# Patient Record
Sex: Male | Born: 1965 | Race: White | Hispanic: No | Smoking: Former smoker
Health system: Southern US, Community
[De-identification: ages and names within clinical notes are randomized; demographics above are authoritative.]

## PROBLEM LIST (undated history)

## (undated) DIAGNOSIS — J392 Other diseases of pharynx: Secondary | ICD-10-CM

## (undated) DIAGNOSIS — Z8489 Family history of other specified conditions: Secondary | ICD-10-CM

## (undated) DIAGNOSIS — I10 Essential (primary) hypertension: Secondary | ICD-10-CM

## (undated) DIAGNOSIS — M1990 Unspecified osteoarthritis, unspecified site: Secondary | ICD-10-CM

## (undated) DIAGNOSIS — E119 Type 2 diabetes mellitus without complications: Secondary | ICD-10-CM

## (undated) DIAGNOSIS — J449 Chronic obstructive pulmonary disease, unspecified: Secondary | ICD-10-CM

## (undated) HISTORY — PX: BACK SURGERY: SHX140

## (undated) HISTORY — PX: MR LOWER LEG LEFT (ARMC HX): HXRAD1784

## (undated) HISTORY — PX: NECK SURGERY: SHX720

## (undated) HISTORY — PX: WRIST SURGERY: SHX841

## (undated) HISTORY — PX: FRACTURE SURGERY: SHX138

---

## 1987-10-19 DIAGNOSIS — S069XAA Unspecified intracranial injury with loss of consciousness status unknown, initial encounter: Secondary | ICD-10-CM

## 1987-10-19 HISTORY — DX: Unspecified intracranial injury with loss of consciousness status unknown, initial encounter: S06.9XAA

## 1998-03-29 ENCOUNTER — Emergency Department (HOSPITAL_COMMUNITY): Admission: EM | Admit: 1998-03-29 | Discharge: 1998-03-29 | Payer: Self-pay | Admitting: Emergency Medicine

## 2001-04-04 ENCOUNTER — Encounter: Payer: Self-pay | Admitting: Emergency Medicine

## 2001-04-04 ENCOUNTER — Emergency Department (HOSPITAL_COMMUNITY): Admission: EM | Admit: 2001-04-04 | Discharge: 2001-04-04 | Payer: Self-pay | Admitting: Emergency Medicine

## 2001-05-20 ENCOUNTER — Inpatient Hospital Stay (HOSPITAL_COMMUNITY): Admission: AC | Admit: 2001-05-20 | Discharge: 2001-06-02 | Payer: Self-pay

## 2001-05-20 ENCOUNTER — Encounter: Payer: Self-pay | Admitting: Emergency Medicine

## 2001-05-22 ENCOUNTER — Encounter: Payer: Self-pay | Admitting: Neurosurgery

## 2001-07-20 ENCOUNTER — Encounter: Payer: Self-pay | Admitting: Neurosurgery

## 2001-07-20 ENCOUNTER — Ambulatory Visit (HOSPITAL_COMMUNITY): Admission: RE | Admit: 2001-07-20 | Discharge: 2001-07-20 | Payer: Self-pay | Admitting: Neurosurgery

## 2001-09-22 ENCOUNTER — Encounter: Payer: Self-pay | Admitting: Specialist

## 2001-09-22 ENCOUNTER — Observation Stay (HOSPITAL_COMMUNITY): Admission: RE | Admit: 2001-09-22 | Discharge: 2001-09-23 | Payer: Self-pay | Admitting: Specialist

## 2002-08-03 ENCOUNTER — Emergency Department (HOSPITAL_COMMUNITY): Admission: AC | Admit: 2002-08-03 | Discharge: 2002-08-03 | Payer: Self-pay

## 2002-08-03 ENCOUNTER — Encounter: Payer: Self-pay | Admitting: Emergency Medicine

## 2007-04-12 ENCOUNTER — Emergency Department: Payer: Self-pay | Admitting: Emergency Medicine

## 2007-05-27 ENCOUNTER — Emergency Department: Payer: Self-pay | Admitting: Emergency Medicine

## 2008-06-07 ENCOUNTER — Emergency Department: Payer: Self-pay | Admitting: Emergency Medicine

## 2008-06-23 ENCOUNTER — Emergency Department (HOSPITAL_COMMUNITY): Admission: EM | Admit: 2008-06-23 | Discharge: 2008-06-23 | Payer: Self-pay | Admitting: Emergency Medicine

## 2008-10-29 ENCOUNTER — Emergency Department: Payer: Self-pay | Admitting: Emergency Medicine

## 2009-06-20 ENCOUNTER — Ambulatory Visit: Payer: Self-pay | Admitting: Psychiatry

## 2009-06-20 ENCOUNTER — Emergency Department (HOSPITAL_COMMUNITY): Admission: EM | Admit: 2009-06-20 | Discharge: 2009-06-20 | Payer: Self-pay | Admitting: Emergency Medicine

## 2009-06-20 ENCOUNTER — Inpatient Hospital Stay (HOSPITAL_COMMUNITY): Admission: RE | Admit: 2009-06-20 | Discharge: 2009-06-26 | Payer: Self-pay | Admitting: Psychiatry

## 2011-01-22 LAB — CBC
HCT: 43.1 % (ref 39.0–52.0)
Hemoglobin: 15 g/dL (ref 13.0–17.0)
MCHC: 34.7 g/dL (ref 30.0–36.0)
MCV: 92.7 fL (ref 78.0–100.0)
Platelets: 138 K/uL — ABNORMAL LOW (ref 150–400)
RBC: 4.65 MIL/uL (ref 4.22–5.81)
RDW: 13.1 % (ref 11.5–15.5)
WBC: 7.5 K/uL (ref 4.0–10.5)

## 2011-01-22 LAB — COMPREHENSIVE METABOLIC PANEL WITH GFR
ALT: 44 U/L (ref 0–53)
AST: 51 U/L — ABNORMAL HIGH (ref 0–37)
Albumin: 3.6 g/dL (ref 3.5–5.2)
Alkaline Phosphatase: 86 U/L (ref 39–117)
BUN: 7 mg/dL (ref 6–23)
CO2: 27 meq/L (ref 19–32)
Calcium: 8.5 mg/dL (ref 8.4–10.5)
Chloride: 104 meq/L (ref 96–112)
Creatinine, Ser: 0.78 mg/dL (ref 0.4–1.5)
GFR calc non Af Amer: >60 mL/min
Glucose, Bld: 94 mg/dL (ref 70–99)
Potassium: 3.8 meq/L (ref 3.5–5.1)
Sodium: 139 meq/L (ref 135–145)
Total Bilirubin: 0.6 mg/dL (ref 0.3–1.2)
Total Protein: 6.3 g/dL (ref 6.0–8.3)

## 2011-01-22 LAB — DIFFERENTIAL
Lymphocytes Relative: 21 % (ref 12–46)
Lymphs Abs: 1.6 10*3/uL (ref 0.7–4.0)
Neutrophils Relative %: 70 % (ref 43–77)

## 2011-01-22 LAB — RAPID URINE DRUG SCREEN, HOSP PERFORMED
Amphetamines: NOT DETECTED
Barbiturates: NOT DETECTED
Cocaine: NOT DETECTED
Opiates: POSITIVE — AB
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
## 2011-03-05 NOTE — Op Note (Signed)
[facility]  Patient:    Clarence Cook, Clarence Cook                      MRN: [id number] Proc. Date: 05/22/01 Adm. Date:  [id number] Disc. Date: [id number] Attending:  Doug Sou                           Operative Report  PREOPERATIVE DIAGNOSIS:  Status post intramedullary rodding, left grade 2 tibia-fibula fracture.  POSTOPERATIVE DIAGNOSIS:  Status post intramedullary rodding, left grade 2 tibia-fibula fracture.  PROCEDURE PERFORMED:  I&D and rotational ______ of the anterior tibialis, application of V.A.C..  ANESTHESIA:  General.  BRIEF HISTORY AND INDICATIONS:  This gentleman is status post IM rodding, I&D on Saturday morning, underwent anterior cervical diskectomy and fusion by Dr. Jordan Likes.  Operative intervention was indicated for relook at the tibial wound for for coverage of the open tibia.  The risks and benefits discussed including bleeding, infection, damage to vascular structures, no changes in symptoms, need for formal coverage.  DESCRIPTION OF PROCEDURE:  With the patient in the supine position after induction of adequate general anesthesia, surgery performed by Dr. Jordan Likes, the left lower extremity dressing was removed, prepped and draped.  The previous retention sutures were removed.  The wound looked viable.  No evidence of necrotic tissue, no purulent drainage.  The wound was copiously irrigated. Antibiotic impregnated irrigation delivered by pulsatile lavage.  Following this, soft tissue coverage was utilized, utilizing the anterior tibial tendon, anterior tibial musculature over the anterior aspect of the tibia and subcutaneous tissue and completely covering the tibia.  Dr. Delia Chimes was consulted for plastic surgery, and he suggested a V.A.C. coverage of the remaining wound, as the skin edges were unable to be approximated.  The V.A.C. dressing was applied.  The sterile end connected to a V.A.C. suction.  After a sterile dressing, the  leg was placed in a stirrup-type splint and secured with an ACE bandage.  The patient was awakened without difficulty and transported to recovery in satisfactory condition.  The patient tolerated the procedure well, and there were no complications. DD:  05/22/01 TD:  05/23/01 Job: [id number]
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
## 2011-03-05 NOTE — Op Note (Signed)
[facility]  Patient:    Clarence Cook, Clarence Cook Visit Number: [id number] MRN: [id number]          Service Type: SUR Location: 4W [id number] [id number] Attending Physician:  Pierce Crane Dictated by:   Javier Docker, M.D. Proc. Date: 09/22/01 Admit Date:  09/22/2001 Discharge Date: 09/23/2001                             Operative Report  PREOPERATIVE DIAGNOSIS:  Retained hardware of the left tibia, delayed union.  POSTOPERATIVE DIAGNOSIS:  Retained hardware of the left tibia, delayed union.  PROCEDURE:  Removal of IM tibial rod, left knee.  ANESTHESIA:  General.  TOURNIQUET TIME:  30 minutes.  BRIEF HISTORY:  This 45 year old is status post intermedullary rodding for a grade 3 significant tibia injury. He has gone on to heal his soft tissues, there is a persistent gap on the needle side of the tibia. It was first thought that otomizing this would be appropriate by removing the cross linking screws but it was felt that due to the fact that the rod was near the ankle joint and only 1 cm or 2 cm below the fracture site that otomizing this would risk projecting the rod through the ankle joint and it was therefore best safe to remove the rod and the screws so he could weightbear on this and hopefully compress the medial aspect. The risks and benefits were discussed including bleeding, infection, injury to neurovascular structures, infection, no change in symptoms, need for osteotomy in the future, etc.  TECHNIQUE:  The patient in supine position, after an adequate level of general anesthesia and 1 gm of Kefzol, the patients left lower extremity was prepped and draped and exsanguinated in the usual sterile fashion. The thigh tourniquet was inflated to 350 mmHg. C-arm augmentation incisions were made over the previous incisions ______ the cross linking screw through the skin only with blunt dissection down to the screw heads without difficulty. Each of the  three were removed. The hardware was in its entirety. The incisions were irrigated and stapled. An incision was made over the knee. A parapatellar arthrotomy was performed. The rod head was identified without difficulty, soft tissue removed, engaged with the rod remover and with the knee in flexion without difficulty the rod was removed. The intermedullary canal was irrigated and curetted. There was no evidence of infection. There was no active bleeding. The arthrotomy was repaired with #1 Vicryl interrupted figure-of-eight sutures, subcutaneous tissue reapproximated with 2-0 Vicryl simple sutures, the skin was reapproximated with staples. The wound was dressed sterilely, secured with an Ace bandage, tourniquet was deflated and there was adequate vascularization in the lower extremity.  The patient tolerated the procedure well with no complications. Blood loss was about 30 cc. Under C-arm augmentation, I was not able to manipulate to determine any significant motion at the fracture site. Dictated by:   Javier Docker, M.D. Attending Physician:  Pierce Crane DD:  09/22/01 TD:  09/23/01 Job: [id number]
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
## 2012-12-25 ENCOUNTER — Emergency Department (HOSPITAL_COMMUNITY)
Admission: EM | Admit: 2012-12-25 | Discharge: 2012-12-25 | Disposition: A | Discharge status: Home / Self Care | Payer: No Typology Code available for payment source | Attending: Emergency Medicine | Admitting: Emergency Medicine

## 2012-12-25 ENCOUNTER — Emergency Department (HOSPITAL_COMMUNITY): Payer: No Typology Code available for payment source

## 2012-12-25 ENCOUNTER — Encounter (HOSPITAL_COMMUNITY): Payer: Self-pay | Admitting: *Deleted

## 2012-12-25 DIAGNOSIS — S298XXA Other specified injuries of thorax, initial encounter: Secondary | ICD-10-CM | POA: Insufficient documentation

## 2012-12-25 DIAGNOSIS — F17200 Nicotine dependence, unspecified, uncomplicated: Secondary | ICD-10-CM | POA: Insufficient documentation

## 2012-12-25 DIAGNOSIS — Y93I9 Activity, other involving external motion: Secondary | ICD-10-CM | POA: Insufficient documentation

## 2012-12-25 DIAGNOSIS — S42002A Fracture of unspecified part of left clavicle, initial encounter for closed fracture: Secondary | ICD-10-CM

## 2012-12-25 DIAGNOSIS — S139XXA Sprain of joints and ligaments of unspecified parts of neck, initial encounter: Secondary | ICD-10-CM | POA: Insufficient documentation

## 2012-12-25 DIAGNOSIS — Z9889 Other specified postprocedural states: Secondary | ICD-10-CM | POA: Insufficient documentation

## 2012-12-25 DIAGNOSIS — R0789 Other chest pain: Secondary | ICD-10-CM

## 2012-12-25 DIAGNOSIS — Y92410 Unspecified street and highway as the place of occurrence of the external cause: Secondary | ICD-10-CM | POA: Insufficient documentation

## 2012-12-25 DIAGNOSIS — S42009A Fracture of unspecified part of unspecified clavicle, initial encounter for closed fracture: Secondary | ICD-10-CM | POA: Insufficient documentation

## 2012-12-25 LAB — POCT I-STAT, CHEM 8
BUN: 13 mg/dL (ref 6–23)
Chloride: 109 mEq/L (ref 96–112)
Creatinine, Ser: 1.1 mg/dL (ref 0.50–1.35)
Potassium: 3.9 mEq/L (ref 3.5–5.1)
Sodium: 142 mEq/L (ref 135–145)

## 2012-12-25 LAB — POCT I-STAT TROPONIN I

## 2012-12-25 MED ORDER — OXYCODONE-ACETAMINOPHEN 5-325 MG PO TABS
2.0000 | ORAL_TABLET | Freq: Once | ORAL | Status: AC
  Administered 2012-12-25: 2 via ORAL
  Filled 2012-12-25: qty 2

## 2012-12-25 MED ORDER — OXYCODONE-ACETAMINOPHEN 5-325 MG PO TABS: 1.0000 | ORAL_TABLET | ORAL | Status: DC | PRN

## 2012-12-25 MED ORDER — IBUPROFEN 800 MG PO TABS: 800.0000 mg | ORAL_TABLET | Freq: Three times a day (TID) | ORAL | Status: DC

## 2012-12-25 NOTE — ED Notes (Signed)
Incentive spirometer given to, instructions given.  Patient able to get to 2500 immediately.

## 2012-12-25 NOTE — ED Notes (Signed)
Advised of the wait time 
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
## 2012-12-25 NOTE — Progress Notes (Signed)
Orthopedic Tech Progress Note Patient Details:  Clarence Cook 12/18/1965 [id number]  Ortho Devices Type of Ortho Device: Sling immobilizer Ortho Device/Splint Location: left arm Ortho Device/Splint Interventions: Application   Crawford, Rembert 12/25/2012, 9:20 PM
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
## 2012-12-25 NOTE — ED Notes (Signed)
Discharge inst given to patient.  Voiced understanding.  Incentive spirometer with patient

## 2012-12-27 NOTE — ED Provider Notes (Signed)
Medical screening examination/treatment/procedure(s) were performed by non-physician practitioner and as supervising physician I was immediately available for consultation/collaboration.  Raeford Razor, MD 12/27/12 2142

## 2013-03-28 ENCOUNTER — Inpatient Hospital Stay (HOSPITAL_COMMUNITY)
Admission: EM | Admit: 2013-03-28 | Discharge: 2013-04-02 | DRG: 552 | Disposition: A | Discharge status: Home / Self Care | Payer: No Typology Code available for payment source | Attending: General Surgery | Admitting: General Surgery

## 2013-03-28 ENCOUNTER — Emergency Department (HOSPITAL_COMMUNITY): Payer: No Typology Code available for payment source

## 2013-03-28 ENCOUNTER — Encounter (HOSPITAL_COMMUNITY): Payer: Self-pay | Admitting: Radiology

## 2013-03-28 DIAGNOSIS — IMO0002 Reserved for concepts with insufficient information to code with codable children: Secondary | ICD-10-CM

## 2013-03-28 DIAGNOSIS — D72829 Elevated white blood cell count, unspecified: Secondary | ICD-10-CM | POA: Diagnosis present

## 2013-03-28 DIAGNOSIS — S12100A Unspecified displaced fracture of second cervical vertebra, initial encounter for closed fracture: Principal | ICD-10-CM | POA: Diagnosis present

## 2013-03-28 DIAGNOSIS — F17200 Nicotine dependence, unspecified, uncomplicated: Secondary | ICD-10-CM | POA: Diagnosis present

## 2013-03-28 DIAGNOSIS — R339 Retention of urine, unspecified: Secondary | ICD-10-CM | POA: Diagnosis present

## 2013-03-28 DIAGNOSIS — S12290A Other displaced fracture of third cervical vertebra, initial encounter for closed fracture: Secondary | ICD-10-CM

## 2013-03-28 DIAGNOSIS — F1210 Cannabis abuse, uncomplicated: Secondary | ICD-10-CM | POA: Diagnosis present

## 2013-03-28 DIAGNOSIS — J4489 Other specified chronic obstructive pulmonary disease: Secondary | ICD-10-CM | POA: Diagnosis present

## 2013-03-28 DIAGNOSIS — J449 Chronic obstructive pulmonary disease, unspecified: Secondary | ICD-10-CM | POA: Diagnosis present

## 2013-03-28 DIAGNOSIS — S065X9A Traumatic subdural hemorrhage with loss of consciousness of unspecified duration, initial encounter: Secondary | ICD-10-CM

## 2013-03-28 DIAGNOSIS — F1010 Alcohol abuse, uncomplicated: Secondary | ICD-10-CM | POA: Diagnosis present

## 2013-03-28 DIAGNOSIS — Y929 Unspecified place or not applicable: Secondary | ICD-10-CM

## 2013-03-28 DIAGNOSIS — S12200A Unspecified displaced fracture of third cervical vertebra, initial encounter for closed fracture: Secondary | ICD-10-CM | POA: Diagnosis present

## 2013-03-28 DIAGNOSIS — F319 Bipolar disorder, unspecified: Secondary | ICD-10-CM | POA: Diagnosis present

## 2013-03-28 MED ORDER — MORPHINE SULFATE 4 MG/ML IJ SOLN
4.0000 mg | Freq: Once | INTRAMUSCULAR | Status: AC
  Administered 2013-03-28: 4 mg via INTRAVENOUS
  Filled 2013-03-28: qty 1
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
## 2013-03-29 ENCOUNTER — Emergency Department (HOSPITAL_COMMUNITY): Payer: No Typology Code available for payment source

## 2013-03-29 ENCOUNTER — Encounter (HOSPITAL_COMMUNITY): Payer: Self-pay | Admitting: Neurological Surgery

## 2013-03-29 DIAGNOSIS — S065X9A Traumatic subdural hemorrhage with loss of consciousness of unspecified duration, initial encounter: Secondary | ICD-10-CM

## 2013-03-29 DIAGNOSIS — S129XXA Fracture of neck, unspecified, initial encounter: Secondary | ICD-10-CM

## 2013-03-29 LAB — POCT I-STAT, CHEM 8
BUN: 5 mg/dL — ABNORMAL LOW (ref 6–23)
Creatinine, Ser: 1.2 mg/dL (ref 0.50–1.35)
Glucose, Bld: 147 mg/dL — ABNORMAL HIGH (ref 70–99)
Hemoglobin: 15.6 g/dL (ref 13.0–17.0)
Potassium: 3.9 mEq/L (ref 3.5–5.1)

## 2013-03-29 LAB — CBC
HCT: 42.1 % (ref 39.0–52.0)
Hemoglobin: 14.4 g/dL (ref 13.0–17.0)
MCH: 31.6 pg (ref 26.0–34.0)
MCHC: 35.4 g/dL (ref 30.0–36.0)
MCV: 89.1 fL (ref 78.0–100.0)
MCV: 89.8 fL (ref 78.0–100.0)
Platelets: 132 10*3/uL — ABNORMAL LOW (ref 150–400)
RBC: 4.97 MIL/uL (ref 4.22–5.81)
RBC: 4.69 MIL/uL (ref 4.22–5.81)
RDW: 13.7 % (ref 11.5–15.5)
WBC: 18.2 10*3/uL — ABNORMAL HIGH (ref 4.0–10.5)

## 2013-03-29 LAB — COMPREHENSIVE METABOLIC PANEL
ALT: 38 U/L (ref 0–53)
AST: 49 U/L — ABNORMAL HIGH (ref 0–37)
Albumin: 4 g/dL (ref 3.5–5.2)
Alkaline Phosphatase: 74 U/L (ref 39–117)
Chloride: 103 mEq/L (ref 96–112)
Potassium: 4.5 mEq/L (ref 3.5–5.1)
Sodium: 139 mEq/L (ref 135–145)
Total Bilirubin: 0.3 mg/dL (ref 0.3–1.2)

## 2013-03-29 LAB — BASIC METABOLIC PANEL
BUN: 6 mg/dL (ref 6–23)
CO2: 25 mEq/L (ref 19–32)
Chloride: 105 mEq/L (ref 96–112)
Creatinine, Ser: 0.8 mg/dL (ref 0.50–1.35)
Glucose, Bld: 132 mg/dL — ABNORMAL HIGH (ref 70–99)

## 2013-03-29 LAB — CDS SEROLOGY

## 2013-03-29 LAB — SAMPLE TO BLOOD BANK

## 2013-03-29 MED ORDER — TRAMADOL HCL 50 MG PO TABS
50.0000 mg | ORAL_TABLET | Freq: Four times a day (QID) | ORAL | Status: DC | PRN
  Administered 2013-03-29: 50 mg via ORAL
  Filled 2013-03-29: qty 1

## 2013-03-29 MED ORDER — BETHANECHOL CHLORIDE 25 MG PO TABS
25.0000 mg | ORAL_TABLET | Freq: Four times a day (QID) | ORAL | Status: DC
  Administered 2013-03-29 – 2013-04-02 (×15): 25 mg via ORAL
  Filled 2013-03-29 (×21): qty 1

## 2013-03-29 MED ORDER — TRAZODONE HCL 100 MG PO TABS
100.0000 mg | ORAL_TABLET | Freq: Every day | ORAL | Status: DC
  Administered 2013-03-29 – 2013-04-01 (×5): 100 mg via ORAL
  Filled 2013-03-29: qty 1
  Filled 2013-03-29: qty 2
  Filled 2013-03-29: qty 1
  Filled 2013-03-29: qty 2
  Filled 2013-03-29 (×2): qty 1
  Filled 2013-03-29: qty 2

## 2013-03-29 MED ORDER — BISACODYL 10 MG RE SUPP: 10.0000 mg | Freq: Every day | RECTAL | Status: DC | PRN

## 2013-03-29 MED ORDER — OLANZAPINE 10 MG PO TABS
20.0000 mg | ORAL_TABLET | Freq: Every day | ORAL | Status: DC
  Administered 2013-03-29 – 2013-04-01 (×5): 20 mg via ORAL
  Filled 2013-03-29: qty 2
  Filled 2013-03-29 (×3): qty 4
  Filled 2013-03-29 (×3): qty 2

## 2013-03-29 MED ORDER — OXYCODONE HCL 5 MG PO TABS
10.0000 mg | ORAL_TABLET | ORAL | Status: DC | PRN
  Administered 2013-03-29 – 2013-03-30 (×4): 10 mg via ORAL
  Filled 2013-03-29 (×4): qty 2

## 2013-03-29 MED ORDER — DOCUSATE SODIUM 100 MG PO CAPS
100.0000 mg | ORAL_CAPSULE | Freq: Two times a day (BID) | ORAL | Status: DC
  Administered 2013-03-29 – 2013-04-02 (×9): 100 mg via ORAL
  Filled 2013-03-29 (×12): qty 1

## 2013-03-29 MED ORDER — LORAZEPAM 2 MG/ML IJ SOLN
1.0000 mg | Freq: Once | INTRAMUSCULAR | Status: AC
  Administered 2013-03-29: 1 mg via INTRAVENOUS
  Filled 2013-03-29: qty 1

## 2013-03-29 MED ORDER — POTASSIUM CHLORIDE IN NACL 20-0.9 MEQ/L-% IV SOLN
INTRAVENOUS | Status: DC
  Administered 2013-03-29: 02:00:00 via INTRAVENOUS
  Filled 2013-03-29 (×3): qty 1000

## 2013-03-29 MED ORDER — PANTOPRAZOLE SODIUM 40 MG PO TBEC
40.0000 mg | DELAYED_RELEASE_TABLET | Freq: Every day | ORAL | Status: DC
  Administered 2013-03-29 – 2013-04-02 (×5): 40 mg via ORAL
  Filled 2013-03-29 (×5): qty 1

## 2013-03-29 MED ORDER — PANTOPRAZOLE SODIUM 40 MG IV SOLR
40.0000 mg | Freq: Every day | INTRAVENOUS | Status: DC
  Filled 2013-03-29 (×3): qty 40

## 2013-03-29 MED ORDER — CITALOPRAM HYDROBROMIDE 20 MG PO TABS
20.0000 mg | ORAL_TABLET | Freq: Every day | ORAL | Status: DC
  Administered 2013-03-29 – 2013-04-02 (×5): 20 mg via ORAL
  Filled 2013-03-29 (×2): qty 2
  Filled 2013-03-29 (×3): qty 1

## 2013-03-29 MED ORDER — HYDROMORPHONE HCL PF 2 MG/ML IJ SOLN
INTRAMUSCULAR | Status: AC
  Administered 2013-03-29: 2 mg
  Filled 2013-03-29: qty 1

## 2013-03-29 MED ORDER — PNEUMOCOCCAL VAC POLYVALENT 25 MCG/0.5ML IJ INJ
0.5000 mL | INJECTION | INTRAMUSCULAR | Status: AC
  Administered 2013-03-30: 0.5 mL via INTRAMUSCULAR
  Filled 2013-03-29 (×2): qty 0.5

## 2013-03-29 MED ORDER — FENOFIBRATE 54 MG PO TABS
54.0000 mg | ORAL_TABLET | Freq: Every day | ORAL | Status: DC
  Administered 2013-03-29 – 2013-04-02 (×5): 54 mg via ORAL
  Filled 2013-03-29 (×5): qty 1

## 2013-03-29 MED ORDER — ONDANSETRON HCL 4 MG PO TABS: 4.0000 mg | ORAL_TABLET | Freq: Four times a day (QID) | ORAL | Status: DC | PRN

## 2013-03-29 MED ORDER — ONDANSETRON HCL 4 MG/2ML IJ SOLN
4.0000 mg | Freq: Four times a day (QID) | INTRAMUSCULAR | Status: DC | PRN
  Administered 2013-03-29: 4 mg via INTRAVENOUS
  Filled 2013-03-29: qty 2

## 2013-03-29 MED ORDER — HYDROMORPHONE HCL PF 1 MG/ML IJ SOLN
1.0000 mg | INTRAMUSCULAR | Status: DC | PRN
  Administered 2013-03-29 (×2): 1 mg via INTRAVENOUS
  Administered 2013-03-29 (×4): 2 mg via INTRAVENOUS
  Administered 2013-03-30: 1 mg via INTRAVENOUS
  Administered 2013-03-30 (×2): 2 mg via INTRAVENOUS
  Administered 2013-03-30: 1 mg via INTRAVENOUS
  Administered 2013-03-31 – 2013-04-01 (×4): 2 mg via INTRAVENOUS
  Administered 2013-04-01 (×4): 1 mg via INTRAVENOUS
  Filled 2013-03-29: qty 1
  Filled 2013-03-29 (×2): qty 2
  Filled 2013-03-29: qty 1
  Filled 2013-03-29: qty 2
  Filled 2013-03-29 (×10): qty 1
  Filled 2013-03-29 (×3): qty 2
  Filled 2013-03-29 (×2): qty 1
  Filled 2013-03-29: qty 2
  Filled 2013-03-29: qty 1
  Filled 2013-03-29 (×4): qty 2
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
## 2013-03-29 NOTE — Progress Notes (Signed)
UR completed 
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
## 2013-03-29 NOTE — ED Notes (Signed)
Pt returned from CT °

## 2013-03-30 LAB — BASIC METABOLIC PANEL
CO2: 28 mEq/L (ref 19–32)
Calcium: 9.4 mg/dL (ref 8.4–10.5)
Creatinine, Ser: 0.84 mg/dL (ref 0.50–1.35)
Glucose, Bld: 127 mg/dL — ABNORMAL HIGH (ref 70–99)

## 2013-03-30 MED ORDER — TIZANIDINE HCL 4 MG PO TABS
4.0000 mg | ORAL_TABLET | Freq: Three times a day (TID) | ORAL | Status: DC | PRN
  Administered 2013-03-30 (×2): 4 mg via ORAL
  Filled 2013-03-30 (×2): qty 1

## 2013-03-30 MED ORDER — OXYCODONE HCL 5 MG PO TABS
10.0000 mg | ORAL_TABLET | ORAL | Status: DC | PRN
  Administered 2013-03-30 – 2013-04-02 (×12): 20 mg via ORAL
  Filled 2013-03-30 (×12): qty 4

## 2013-03-30 MED ORDER — OXYCODONE HCL 5 MG PO TABS
5.0000 mg | ORAL_TABLET | ORAL | Status: DC | PRN
  Administered 2013-03-30: 15 mg via ORAL
  Filled 2013-03-30: qty 3

## 2013-03-30 MED ORDER — TRAMADOL HCL 50 MG PO TABS
100.0000 mg | ORAL_TABLET | Freq: Four times a day (QID) | ORAL | Status: DC
  Administered 2013-03-30 – 2013-04-02 (×10): 100 mg via ORAL
  Filled 2013-03-30 (×20): qty 2
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
## 2013-03-31 MED ORDER — OXYCODONE HCL 5 MG PO TABS: 5.0000 mg | ORAL_TABLET | ORAL | Status: DC | PRN

## 2013-03-31 NOTE — Progress Notes (Signed)
Adjust pain meds.  Voiding trial Sunday.

## 2013-03-31 NOTE — Progress Notes (Signed)
Physical Therapy Treatment Patient Details Name: Clarence Cook MRN: [id number] DOB: [birth year] Today's Date: 03/31/2013 Time: 4098-1191 PT Time Calculation (min): 19 min  PT Assessment / Plan / Recommendation Comments on Treatment Session  Making progress with mobility.  Reported pain continues to limit mobility, however patient very groggy following pain meds.    Follow Up Recommendations  No PT follow up     Does the patient have the potential to tolerate intense rehabilitation     Barriers to Discharge        Equipment Recommendations  Rolling walker with 5" wheels    Recommendations for Other Services    Frequency Min 5X/week   Plan Discharge plan remains appropriate;Frequency remains appropriate;Equipment recommendations need to be updated    Precautions / Restrictions Precautions Precautions: Cervical;Fall Required Braces or Orthoses: Cervical Brace Cervical Brace: Hard collar Restrictions Weight Bearing Restrictions: No   Pertinent Vitals/Pain     Mobility  Bed Mobility Bed Mobility: Not assessed Transfers Transfers: Sit to Stand;Stand to Sit Sit to Stand: 4: Min assist;With upper extremity assist;With armrests;From chair/3-in-1 Stand to Sit: 4: Min assist;With upper extremity assist;With armrests;To chair/3-in-1 Details for Transfer Assistance: Verbal cues for hand placement and technique.  Assist to rise to standing, and for balance.  Assist to control descent into chair. Ambulation/Gait Ambulation/Gait Assistance: 4: Min assist Ambulation Distance (Feet): 84 Feet Assistive device: Rolling walker Ambulation/Gait Assistance Details: Verbal cues for safe use of RW.  Required physical assist to maneuver RW - veering to right.  Verbal cues to remain upright and look ahead during gait. Gait Pattern: Step-through pattern;Decreased step length - right;Decreased step length - left Gait velocity: Slow gait speed    Exercises General Exercises - Lower  Extremity Ankle Circles/Pumps: AROM;Both;10 reps;Seated Other Exercises Other Exercises: Gentle shoulder circles in seated position    PT Goals Acute Rehab PT Goals Pt will go Sit to Stand: Independently;with upper extremity assist PT Goal: Sit to Stand - Progress: Progressing toward goal Pt will Ambulate: >150 feet;Independently PT Goal: Ambulate - Progress: Progressing toward goal  Visit Information  Last PT Received On: 03/31/13 Assistance Needed: +1    Subjective Data  Subjective: Patient c/o pain. Patient Stated Goal: To decrease pain   Cognition  Cognition Arousal/Alertness: Awake/alert Behavior During Therapy: WFL for tasks assessed/performed Overall Cognitive Status: Impaired/Different from baseline Area of Impairment: Memory Memory: Decreased short-term memory General Comments: Patient reports short-term memory issues    Balance     End of Session PT - End of Session Equipment Utilized During Treatment: Gait belt;Cervical collar Activity Tolerance: Patient limited by pain Patient left: in chair;with call bell/phone within reach Nurse Communication: Mobility status   GP     Vena Austria 03/31/2013, 12:27 PM Durenda Hurt. Renaldo Fiddler, [facility] Acute Rehab Services Pager [phone]

## 2013-03-31 NOTE — Progress Notes (Signed)
Patient ID: Clarence Cook, male   DOB: [birth year], 47 y.o.   MRN: [id number] Stable, on cervical collar. He needs to call dr Marikay Alar for a follow up once he is discharged.
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
## 2013-04-01 LAB — CBC
HCT: 39.1 % (ref 39.0–52.0)
Hemoglobin: 13.2 g/dL (ref 13.0–17.0)
MCH: 30.4 pg (ref 26.0–34.0)
MCHC: 33.8 g/dL (ref 30.0–36.0)
MCV: 90.1 fL (ref 78.0–100.0)

## 2013-04-01 MED ORDER — FLEET ENEMA 7-19 GM/118ML RE ENEM
1.0000 | ENEMA | Freq: Every day | RECTAL | Status: DC | PRN
  Filled 2013-04-01: qty 1

## 2013-04-01 MED ORDER — DIAZEPAM 5 MG PO TABS
5.0000 mg | ORAL_TABLET | Freq: Four times a day (QID) | ORAL | Status: DC | PRN
  Administered 2013-04-01 – 2013-04-02 (×4): 5 mg via ORAL
  Filled 2013-04-01 (×4): qty 1

## 2013-04-01 MED ORDER — LITHIUM CARBONATE 300 MG PO CAPS
300.0000 mg | ORAL_CAPSULE | Freq: Three times a day (TID) | ORAL | Status: DC
  Administered 2013-04-01 – 2013-04-02 (×4): 300 mg via ORAL
  Filled 2013-04-01 (×6): qty 1

## 2013-04-01 MED ORDER — MOMETASONE FURO-FORMOTEROL FUM 100-5 MCG/ACT IN AERO
2.0000 | INHALATION_SPRAY | Freq: Two times a day (BID) | RESPIRATORY_TRACT | Status: DC
  Administered 2013-04-01 – 2013-04-02 (×3): 2 via RESPIRATORY_TRACT
  Filled 2013-04-01: qty 8.8

## 2013-04-01 MED ORDER — BISACODYL 5 MG PO TBEC
10.0000 mg | DELAYED_RELEASE_TABLET | Freq: Every day | ORAL | Status: DC | PRN
  Administered 2013-04-01: 10 mg via ORAL
  Filled 2013-04-01 (×2): qty 1

## 2013-04-01 MED ORDER — ALBUTEROL SULFATE HFA 108 (90 BASE) MCG/ACT IN AERS
2.0000 | INHALATION_SPRAY | Freq: Four times a day (QID) | RESPIRATORY_TRACT | Status: DC | PRN
  Filled 2013-04-01: qty 6.7

## 2013-04-01 NOTE — Progress Notes (Signed)
Subjective: Patient reports still tipped to right.  Objective: Vital signs in last 24 hours: Temp:  [98.4 F (36.9 C)-99.8 F (37.7 C)] 99.7 F (37.6 C) (06/15 0527) Pulse Rate:  [72-101] 101 (06/15 0527) Resp:  [16-18] 18 (06/15 0527) BP: (119-146)/(75-93) 119/79 mmHg (06/15 0527) SpO2:  [93 %-96 %] 94 % (06/15 0527)  Intake/Output from previous day: 06/14 0701 - 06/15 0700 In: 560 [P.O.:560] Out: 325 [Urine:325] Intake/Output this shift:    Physical Exam: I adjusted collar to fit patient better. Head cocked to right.  Lab Results:  Recent Labs  04/01/13 0510  WBC 11.7*  HGB 13.2  HCT 39.1  PLT 173   BMET  Recent Labs  03/30/13 0545  NA 137  K 4.7  CL 101  CO2 28  GLUCOSE 127*  BUN 6  CREATININE 0.84  CALCIUM 9.4    Studies/Results: No results found.  Assessment/Plan: Will start some diazepam, which should help with muscular spasm; will see if patient's neck straightens with muscle relaxant.    LOS: 4 days    Dorian Heckle, MD 04/01/2013, 7:40 AM
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
## 2013-04-02 MED ORDER — TIZANIDINE HCL 4 MG PO TABS: 4.0000 mg | ORAL_TABLET | Freq: Three times a day (TID) | ORAL | Status: DC | PRN

## 2013-04-02 MED ORDER — TRAMADOL HCL 50 MG PO TABS: 100.0000 mg | ORAL_TABLET | Freq: Four times a day (QID) | ORAL | Status: DC

## 2013-04-02 MED ORDER — OXYCODONE-ACETAMINOPHEN 10-325 MG PO TABS: 1.0000 | ORAL_TABLET | ORAL | Status: AC | PRN

## 2013-04-02 NOTE — Progress Notes (Signed)
Pt seen and agreed. Wants  rehahab.

## 2013-04-02 NOTE — Progress Notes (Signed)
Pt enrolled in [facility] program for medication assistance.  Directions given to our outpatient pharmacy off [facility] since pt's pharmacy in [place] does not participate in the program.  Letter printed and explained to patient and family. They stated understanding and denied further questions. Pt stated he was able to pay the $3 copays for 3 medicines.
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
## 2013-04-02 NOTE — Progress Notes (Signed)
Pt discharged to home

## 2013-04-02 NOTE — Progress Notes (Signed)
Physical Therapy Treatment Patient Details Name: Clarence Cook MRN: [id number] DOB: 07-Jan-1966 Today's Date: 04/02/2013 Time: 1478-2956 PT Time Calculation (min): 12 min  PT Assessment / Plan / Recommendation Comments on Treatment Session  Supervision to modified independent with all mobility; No further acute PT needs noted; REcommend pt use a RW prn for steadiness with amb; Discussed dc home and potential for further PT; no HHPT needs noted; May possibly benefit from Outpt PT for neck and shoulders when appropriate -- this can be addressed at follow-up appts    Follow Up Recommendations  No PT follow up (potential benefit of Outpt PT can be addressed at MD f/u)     Does the patient have the potential to tolerate intense rehabilitation     Barriers to Discharge        Equipment Recommendations  Rolling walker with 5" wheels    Recommendations for Other Services    Frequency Min 5X/week   Plan Discharge plan remains appropriate;Frequency remains appropriate;Equipment recommendations need to be updated    Precautions / Restrictions Precautions Precautions: Cervical;Fall Required Braces or Orthoses: Cervical Brace Cervical Brace: Hard collar Restrictions Weight Bearing Restrictions: No   Pertinent Vitals/Pain 7/10 neck and shoulder pain    Mobility  Bed Mobility Bed Mobility: Not assessed Transfers Transfers: Not assessed Ambulation/Gait Ambulation/Gait Assistance: 5: Supervision;6: Modified independent (Device/Increase time) Ambulation Distance (Feet): 150 Feet Assistive device: Rolling walker (and none) Ambulation/Gait Assistance Details: No gross loss of balance noted Gait Pattern:  (approaching WNL) Stairs: Yes Stairs Assistance: 5: Supervision Stairs Assistance Details (indicate cue type and reason): Cues to self-monitor for activity tol Stair Management Technique: One rail Right;Alternating pattern;Forwards Number of Stairs: 12    Exercises     PT Diagnosis:     PT Problem List:   PT Treatment Interventions:     PT Goals Acute Rehab PT Goals Time For Goal Achievement: 04/06/13 Potential to Achieve Goals: Good Pt will Ambulate: >150 feet;Independently PT Goal: Ambulate - Progress: Partly met Pt will Go Up / Down Stairs: 3-5 stairs;with modified independence;with rail(s) PT Goal: Up/Down Stairs - Progress: Partly met  Visit Information  Last PT Received On: 04/02/13 Assistance Needed: +1    Subjective Data  Subjective: Reports pain in neck and shoulders   Cognition  Cognition Arousal/Alertness: Awake/alert Behavior During Therapy: WFL for tasks assessed/performed Overall Cognitive Status: Impaired/Different from baseline Area of Impairment: Memory Memory: Decreased short-term memory General Comments: Patient reports short-term memory issues    Balance     End of Session PT - End of Session Equipment Utilized During Treatment: Cervical collar Activity Tolerance: Patient tolerated treatment well Patient left: Other (comment) (Up modified independently in room with sister present)   GP     [facility] [place], [place] [phone]  04/02/2013, 10:40 AM

## 2013-04-02 NOTE — Clinical Social Work Note (Signed)
Clinical Social Worker continuing to follow patient for support and discharge planning needs.  Patient and patient sister mentioned to MD about the possibility of residential/intensive outpatient substance abuse programs.  CSW followed up with patient and patient sister to provide brochure on chronic alcohol use and a list of potential options for patient in regards to residential and intensive outpatient programs.  Patient sister requested that the referral process be started prior to discharge.  CSW contacted [facility] and initiated referral - patient and family aware of a 1-2 month wait list.  Patient does not qualify for [facility] due to living in [place] and [place] does not have bed availability today (day by day bed availability).  CSW provided all information to patient and patient sister for follow up.  Patient plans to discharge home today with family providing transportation.  Clinical Social Worker will sign off for now as social work intervention is no longer needed. Please consult [place] again if new need arises.  Macario [place], [place] [phone]

## 2013-04-03 NOTE — Discharge Summary (Signed)
Clarence Loseke, MD, MPH, FACS Pager: [phone]  

## 2013-06-27 ENCOUNTER — Other Ambulatory Visit: Payer: Self-pay | Admitting: Neurological Surgery

## 2013-06-27 DIAGNOSIS — S129XXA Fracture of neck, unspecified, initial encounter: Secondary | ICD-10-CM

## 2013-07-03 ENCOUNTER — Ambulatory Visit
Admission: RE | Admit: 2013-07-03 | Discharge: 2013-07-03 | Disposition: A | Discharge status: Home / Self Care | Payer: PRIVATE HEALTH INSURANCE | Source: Ambulatory Visit | Attending: Neurological Surgery | Admitting: Neurological Surgery

## 2013-07-03 DIAGNOSIS — S129XXA Fracture of neck, unspecified, initial encounter: Secondary | ICD-10-CM

## 2022-01-31 ENCOUNTER — Other Ambulatory Visit: Payer: Self-pay

## 2022-01-31 ENCOUNTER — Emergency Department
Admission: EM | Admit: 2022-01-31 | Discharge: 2022-01-31 | Disposition: A | Discharge status: Home / Self Care | Payer: BLUE CROSS/BLUE SHIELD | Attending: Emergency Medicine | Admitting: Emergency Medicine

## 2022-01-31 ENCOUNTER — Encounter: Payer: Self-pay | Admitting: Emergency Medicine

## 2022-01-31 ENCOUNTER — Emergency Department: Payer: BLUE CROSS/BLUE SHIELD

## 2022-01-31 DIAGNOSIS — F17200 Nicotine dependence, unspecified, uncomplicated: Secondary | ICD-10-CM | POA: Diagnosis not present

## 2022-01-31 DIAGNOSIS — Y9389 Activity, other specified: Secondary | ICD-10-CM | POA: Insufficient documentation

## 2022-01-31 DIAGNOSIS — S61216A Laceration without foreign body of right little finger without damage to nail, initial encounter: Secondary | ICD-10-CM

## 2022-01-31 DIAGNOSIS — S51811A Laceration without foreign body of right forearm, initial encounter: Secondary | ICD-10-CM

## 2022-01-31 DIAGNOSIS — Z23 Encounter for immunization: Secondary | ICD-10-CM | POA: Diagnosis not present

## 2022-01-31 DIAGNOSIS — S92324A Nondisplaced fracture of second metatarsal bone, right foot, initial encounter for closed fracture: Secondary | ICD-10-CM | POA: Diagnosis not present

## 2022-01-31 DIAGNOSIS — W228XXA Striking against or struck by other objects, initial encounter: Secondary | ICD-10-CM | POA: Diagnosis not present

## 2022-01-31 DIAGNOSIS — E119 Type 2 diabetes mellitus without complications: Secondary | ICD-10-CM | POA: Diagnosis not present

## 2022-01-31 DIAGNOSIS — S99921A Unspecified injury of right foot, initial encounter: Secondary | ICD-10-CM | POA: Diagnosis present

## 2022-01-31 HISTORY — DX: Type 2 diabetes mellitus without complications: E11.9

## 2022-01-31 MED ORDER — TETANUS-DIPHTH-ACELL PERTUSSIS 5-2.5-18.5 LF-MCG/0.5 IM SUSY
0.5000 mL | PREFILLED_SYRINGE | Freq: Once | INTRAMUSCULAR | Status: AC
  Administered 2022-01-31: 0.5 mL via INTRAMUSCULAR
  Filled 2022-01-31: qty 0.5

## 2022-01-31 MED ORDER — LIDOCAINE HCL (PF) 1 % IJ SOLN
INTRAMUSCULAR | Status: AC
  Administered 2022-01-31: 30 mL
  Filled 2022-01-31: qty 5

## 2022-01-31 MED ORDER — LIDOCAINE HCL (PF) 1 % IJ SOLN
5.0000 mL | Freq: Once | INTRAMUSCULAR | Status: AC
  Administered 2022-01-31: 5 mL
  Filled 2022-01-31: qty 5

## 2022-01-31 MED ORDER — LIDOCAINE-EPINEPHRINE (PF) 2 %-1:200000 IJ SOLN
10.0000 mL | Freq: Once | INTRAMUSCULAR | Status: DC
  Filled 2022-01-31: qty 20

## 2022-01-31 MED ORDER — LIDOCAINE HCL 1 % IJ SOLN: 30.0000 mL | Freq: Once | INTRAMUSCULAR | Status: AC
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
## 2022-01-31 NOTE — ED Provider Notes (Signed)
? ?[facility] ?Provider Note ? ? ? Event Date/Time  ? First MD Initiated Contact with Patient 01/31/22 [phone]   ?  (approximate) ? ? ?History  ? ?Laceration ? ? ?HPI ? ?Clarence Cook is a 56 y.o. male   presents to the ED from home with laceration to his right fifth finger and right forearm due to punching a glass window.  Patient is unaware of his last tetanus.  Patient also is concerned of his foot as he was seen 3 weeks ago at his PCPs office after stepping on a nail.  He is concerned about possible retained objects.  He currently is taking an antibiotic that was prescribed by his PCP to prevent infection.  Patient has a history of non-insulin-dependent diabetes and is a smoker.  He rates his pain as 7/10. ? ?  ? ? ?Physical Exam  ? ?Triage Vital Signs: ?ED Triage Vitals  ?Enc Vitals Group  ?   BP 01/31/22 0913 134/87  ?   Pulse Rate 01/31/22 0913 92  ?   Resp 01/31/22 0913 18  ?   Temp 01/31/22 0913 97.7 ?F (36.5 ?C)  ?   Temp Source 01/31/22 0913 Oral  ?   SpO2 01/31/22 0913 99 %  ?   Weight 01/31/22 0912 187 lb (84.8 kg)  ?   Height 01/31/22 0912 5\' 7"  (1.702 m)  ?   Head Circumference --   ?   Peak Flow --   ?   Pain Score 01/31/22 0912 7  ?   Pain Loc --   ?   Pain Edu? --   ?   Excl. in [place]? --   ? ? ?Most recent vital signs: ?Vitals:  ? 01/31/22 0913  ?BP: 134/87  ?Pulse: 92  ?Resp: 18  ?Temp: 97.7 ?F (36.5 ?C)  ?SpO2: 99%  ? ? ? ?General: Awake, no distress.  ?CV:  Good peripheral perfusion.  ?Resp:  Normal effort.  ?Abd:  No distention.  ?Other:  Right fifth digit with laceration over the PIP joint dorsal aspect.  Range of motion is normal for patient.  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  Motor or sensory function intact.  Capillary refill is less than 3 seconds.  Right forearm volar aspect there is a 2.5 cm laceration that is actively bleeding.  No foreign body present on inspection.  Pressure dressing was  applied. ? ? ?ED Results / Procedures / Treatments  ? ?Labs ?(all labs ordered are listed, but only abnormal results are displayed) ?Labs Reviewed - No data to display ? ? ? ?RADIOLOGY ? ?X-ray of the left foot was reviewed by me independently and no foreign body was noted.  Radiology report also confirms no foreign body however they do mention that it appears that patient has a fracture to the distal portion of his second metatarsal. ?Right forearm x-ray reviewed by me was negative for fracture and radiologist suggest that there is some opaque foreign body in the wound. ?X-ray of the right hand reviewed by myself was negative for fracture.  Radiology report comments on the degenerative changes and chronic deformity of the fifth digit but no acute fracture. ? ? ?PROCEDURES: ? ?Critical Care performed:  ? ?Marland Kitchen.Laceration Repair ? ?Date/Time: 01/31/2022 11:21 AM ?Performed by: Johnn Hai, PA-C ?Authorized by: Johnn Hai, PA-C  ? ?Consent:  ?  Consent obtained:  Verbal ?  Consent given by:  Patient ?  Risks discussed:  Infection, pain, poor wound healing and poor cosmetic result ?Universal protocol:  ?  Patient identity confirmed:  Verbally with patient ?Anesthesia:  ?  Anesthesia method:  Nerve block ?  Block location:  5th digit ?  Block needle gauge:  25 G ?  Block anesthetic:  Lidocaine 1% w/o epi ?  Block injection procedure:  Anatomic landmarks identified, introduced needle, incremental injection and negative aspiration for blood ?  Block outcome:  Anesthesia achieved ?Laceration details:  ?  Location:  Finger ?  Finger location:  R small finger ?  Length (cm):  1.5 ?Pre-procedure details:  ?  Preparation:  Patient was prepped and draped in usual sterile fashion and imaging obtained to evaluate for foreign bodies ?Exploration:  ?  Hemostasis achieved with:  Direct pressure ?  Imaging outcome: foreign body not noted   ?  Contaminated: no   ?Treatment:  ?  Area cleansed with:  Saline ?  Amount of  cleaning:  Standard ?  Irrigation solution:  Sterile saline ?  Irrigation method:  Tap ?  Visualized foreign bodies/material removed: no   ?Skin repair:  ?  Repair method:  Sutures ?  Suture size:  4-0 ?  Suture material:  Nylon ?  Suture technique:  Simple interrupted ?  Number of sutures:  3 ?Approximation:  ?  Approximation:  Loose ?Repair type:  ?  Repair type:  Simple ?Post-procedure details:  ?  Dressing:  Non-adherent dressing ?  Procedure completion:  Tolerated ?Marland Kitchen.Laceration Repair ? ?Date/Time: 01/31/2022 11:33 PM ?Performed by: Johnn Hai, PA-C ?Authorized by: Johnn Hai, PA-C  ? ?Consent:  ?  Consent obtained:  Verbal ?  Consent given by:  Patient ?  Risks discussed:  Pain, infection, poor wound healing, poor cosmetic result and retained foreign body ?Anesthesia:  ?  Anesthesia method:  Local infiltration ?  Local anesthetic:  Lidocaine 1% WITH epi ?Laceration details:  ?  Location:  Shoulder/arm ?  Shoulder/arm location:  R lower arm ?  Length (cm):  2.5 ?Pre-procedure details:  ?  Preparation:  Patient was prepped and draped in usual sterile fashion and imaging obtained to evaluate for foreign bodies ?Exploration:  ?  Limited defect created (wound extended): no   ?  Hemostasis achieved with:  Tied off vessels, epinephrine and direct pressure ?  Imaging obtained: x-ray   ?  Imaging outcome: foreign body noted   ?  Wound exploration: wound explored through full range of motion and entire depth of wound visualized   ?  Contaminated: no   ?Treatment:  ?  Area cleansed with:  Saline ?  Amount of cleaning:  Extensive ?  Irrigation solution:  Sterile saline ?  Irrigation volume:  120 ml ?  Irrigation method:  Pressure wash ?  Visualized foreign bodies/material removed: yes   ?  Layers/structures repaired:  Deep dermal/superficial fascia ?Deep dermal/superficial fascia:  ?  Suture size:  5-0 ?  Suture material:  Vicryl ?  Suture technique:  Simple interrupted ?  Number of sutures:  3 ?Skin repair:   ?  Repair method:  Sutures ?  Suture size:  4-0 ?  Suture material:  Nylon ?  Suture technique:  Simple interrupted ?  Number of sutures:  4 ?Approximation:  ?  Approximation:  Close ?Repair type:  ?  Repair type:  Intermediate ?Post-procedure details:  ?  Dressing:  Non-adherent dressing ? ? ?MEDICATIONS ORDERED IN ED: ?  Medications  ?lidocaine-EPINEPHrine (XYLOCAINE W/EPI) 2 %-1:200000 (PF) injection 10 mL (10 mLs Infiltration Not Given 01/31/22 1238)  ?Tdap (BOOSTRIX) injection 0.5 mL (0.5 mLs Intramuscular Given 01/31/22 0943)  ?lidocaine (XYLOCAINE) 1 % (with pres) injection 30 mL (30 mLs Infiltration Given by Other 01/31/22 1237)  ?lidocaine (PF) (XYLOCAINE) 1 % injection 5 mL (5 mLs Infiltration Given by Other 01/31/22 1238)  ? ? ? ?IMPRESSION / MDM / ASSESSMENT AND PLAN / ED COURSE  ?I reviewed the triage vital signs and the nursing notes. ? ? ?Differential diagnosis includes, but is not limited to, fracture right hand, fracture right forearm, foreign body, tendon injury, left foot retained foreign body. ? ? ?56 year old male presents to the ED with laceration to his right fifth finger and also to his right forearm.  This occurred this morning when he put his fist through a window cutting himself.  Patient was given a tetanus booster and x-ray showed small superficial foreign body to the laceration of the forearm.  Bleeding was controlled.  Patient tolerated procedure well.  He was given instructions on how to to care for this and also strongly suggested to have his sutures removed in 10 to 12 days.  He also was reassured that there was no retained foreign body in his foot but he does have a nondisplaced fracture of his second metatarsal which most likely happened 3 weeks ago when he had his initial injury.  He currently is taking antibiotics from his PCP for that injury.  He is not aware of the name of the antibiotic will but will follow-up with the PCP for his foot due to his diabetes and also to have sutures  removed. ? ? ?  ? ? ?FINAL CLINICAL IMPRESSION(S) / ED DIAGNOSES  ? ?Final diagnoses:  ?Laceration of right little finger without foreign body without damage to nail, initial encounter  ?Laceration of right for

## 2022-01-31 NOTE — Discharge Instructions (Addendum)
Follow-up with your primary care provider for suture removal in 10 to 12 days.  It may take a little longer since you are diabetic for these areas to heal and also the skin on your fifth finger was partially missing and was loosely Closed because of this.  Watch for any signs of infection and continue taking the antibiotic that was prescribed by your primary care provider for your foot injury.  You do have a fracture of the second toe ?

## 2022-03-01 ENCOUNTER — Ambulatory Visit: Payer: BLUE CROSS/BLUE SHIELD | Admitting: Podiatry

## 2022-04-13 ENCOUNTER — Emergency Department (HOSPITAL_COMMUNITY)
Admission: EM | Admit: 2022-04-13 | Discharge: 2022-04-13 | Disposition: A | Discharge status: Home / Self Care | Payer: BLUE CROSS/BLUE SHIELD | Attending: Emergency Medicine | Admitting: Emergency Medicine

## 2022-04-13 ENCOUNTER — Other Ambulatory Visit: Payer: Self-pay

## 2022-04-13 ENCOUNTER — Encounter (HOSPITAL_COMMUNITY): Payer: Self-pay

## 2022-04-13 DIAGNOSIS — Z7984 Long term (current) use of oral hypoglycemic drugs: Secondary | ICD-10-CM | POA: Insufficient documentation

## 2022-04-13 DIAGNOSIS — S0990XA Unspecified injury of head, initial encounter: Secondary | ICD-10-CM | POA: Diagnosis present

## 2022-04-13 DIAGNOSIS — E119 Type 2 diabetes mellitus without complications: Secondary | ICD-10-CM | POA: Diagnosis not present

## 2022-04-13 DIAGNOSIS — W450XXA Nail entering through skin, initial encounter: Secondary | ICD-10-CM | POA: Diagnosis not present

## 2022-04-13 MED ORDER — METFORMIN HCL 500 MG PO TABS
1000.0000 mg | ORAL_TABLET | Freq: Once | ORAL | Status: AC
  Administered 2022-04-13: 1000 mg via ORAL
  Filled 2022-04-13: qty 2

## 2022-04-13 NOTE — ED Triage Notes (Signed)
Pt was in a barn, hit his head on a nail, has a small 0.5cm laceration/scratch to the top of the head. Admits to drinking. Is in law enforcement custody and the officer said that the jail wants a ct to clear the head injury.

## 2022-04-13 NOTE — ED Provider Notes (Signed)
[facility]ENC[facility]PT Provider Note   CSN: [id number] Arrival date & time: 04/13/22  2221     History  Chief Complaint  Patient presents with   Head Injury    Clarence Cook is a 56 y.o. male.   Head Injury Patient is a 56 year old male with a past medical history significant for diabetes presented emergency room today for puncture wound to his scalp and head injury.  He states that he had 2 drinks earlier today and then went over to a family ember's house to get his metformin.  He states that he got an argument with his family member, police officers were called and he was arrested.  Seems that this head injury occurred around 3:30 PM.  More than 7 hours ago at the time of this evaluation.  He denies any headache nausea vomiting he has not had any repetitive questioning.  He states he feels sober and appears clinically sober.  He states that he last had a tetanus vaccine within the past year.    Home Medications Prior to Admission medications   Medication Sig Start Date End Date Taking? Authorizing Provider  albuterol (PROVENTIL HFA;VENTOLIN HFA) 108 (90 BASE) MCG/ACT inhaler Inhale 2 puffs into the lungs every 6 (six) hours as needed for wheezing.    [provider]  citalopram (CELEXA) 40 MG tablet Take 40 mg by mouth daily.    [provider]  fenofibrate (TRICOR) 145 MG tablet Take 145 mg by mouth daily.    [provider]  Fluticasone-Salmeterol (ADVAIR) 250-50 MCG/DOSE AEPB Inhale 1 puff into the lungs every 12 (twelve) hours.    [provider]  lithium carbonate 300 MG capsule Take 300 mg by mouth 3 (three) times daily with meals.    [provider]  OLANZapine (ZYPREXA) 20 MG tablet Take 20 mg by mouth at bedtime.    [provider]  tiZANidine (ZANAFLEX) 4 MG tablet Take 1 tablet (4 mg total) by mouth every 8 (eight) hours as needed (muscle spasm). 04/02/13   Freeman Caldron, PA-C   traMADol (ULTRAM) 50 MG tablet Take 2 tablets (100 mg total) by mouth every 6 (six) hours. 04/02/13   Freeman Caldron, PA-C  traZODone (DESYREL) 100 MG tablet Take 200 mg by mouth at bedtime.    [provider]      Allergies    Codeine    Review of Systems   Review of Systems  Physical Exam Updated Vital Signs BP (!) 132/93   Pulse 93   Temp 97.8 F (36.6 C) (Oral)   Resp 17   Wt 84.4 kg   SpO2 99%   BMI 29.13 kg/m  Physical Exam Vitals and nursing note reviewed.  Constitutional:      General: He is not in acute distress. HENT:     Head: Normocephalic and atraumatic.     Nose: Nose normal.  Eyes:     General: No scleral icterus. Cardiovascular:     Rate and Rhythm: Normal rate and regular rhythm.     Pulses: Normal pulses.     Heart sounds: Normal heart sounds.  Pulmonary:     Effort: Pulmonary effort is normal. No respiratory distress.     Breath sounds: No wheezing.  Abdominal:     Palpations: Abdomen is soft.     Tenderness: There is no abdominal tenderness.  Musculoskeletal:     Cervical back: Normal range of motion.     Right lower leg:  No edema.     Left lower leg: No edema.  Skin:    General: Skin is warm and dry.     Capillary Refill: Capillary refill takes less than 2 seconds.  Neurological:     Mental Status: He is alert. Mental status is at baseline.     Comments: Alert and oriented to self, place, time and event.   Speech is fluent, clear without dysarthria or dysphasia.   Strength 5/5 in upper/lower extremities   Sensation intact in upper/lower extremities   .  CN I not tested  CN II grossly intact visual fields bilaterally. Did not visualize posterior eye.  CN III, IV, VI PERRLA and EOMs intact bilaterally  CN V Intact sensation to sharp and light touch to the face  CN VII facial movements symmetric  CN VIII not tested  CN IX, X no uvula deviation, symmetric rise of soft palate  CN XI 5/5 SCM and trapezius strength bilaterally   CN XII Midline tongue protrusion, symmetric L/R movements     Psychiatric:        Mood and Affect: Mood normal.        Behavior: Behavior normal.     ED Results / Procedures / Treatments   Labs (all labs ordered are listed, but only abnormal results are displayed) Labs Reviewed - No data to display  EKG None  Radiology No results found.  Procedures Procedures    Medications Ordered in ED Medications  metFORMIN (GLUCOPHAGE) tablet 1,000 mg (1,000 mg Oral Given 04/13/22 2313)    ED Course/ Medical Decision Making/ A&P                           Medical Decision Making Risk Prescription drug management.   Patient is a 56 year old male with a past medical history significant for diabetes presented emergency room today for puncture wound to his scalp and head injury.  He states that he had 2 drinks earlier today and then went over to a family ember's house to get his metformin.  He states that he got an argument with his family member, police officers were called and he was arrested.  Seems that this head injury occurred around 3:30 PM.  More than 7 hours ago at the time of this evaluation.  He denies any headache nausea vomiting he has not had any repetitive questioning.  He states he feels sober and appears clinically sober.  He states that he last had a tetanus vaccine within the past year.   Patient is clinically sober, alert and oriented x3, following commands and neurologically intact.  The head injury occurred over 7 hours ago mild and he is overall very well-appearing.  He is a small superficial scrape on his scalp which is scabbed over I have cleaned all of the scab off no active bleeding occurred during this and it is shallow/more of a scrape to the puncture.  He did not lose consciousness has not had any repetitive questioning no confusion no evidence of head injury other than the puncture wound and I offered CT head although I did counsel him that they do not think  it is quite necessary.  Given that he is sober I was able to apply the reviewed head CT rules which is negative.  He is not on blood thinners.  [facility].  Patient agreeable to plan given his home dose of metformin prior to discharge  Final  Clinical Impression(s) / ED Diagnoses Final diagnoses:  Minor head injury, initial encounter    Rx / DC Orders ED Discharge Orders     None         Gailen Shelter, [place] 04/13/22 2345    Arby Barrette, MD 04/16/22 1232

## 2022-06-15 ENCOUNTER — Other Ambulatory Visit: Payer: Self-pay | Admitting: Orthopedic Surgery

## 2022-06-24 ENCOUNTER — Inpatient Hospital Stay: Admission: RE | Admit: 2022-06-24 | Payer: BLUE CROSS/BLUE SHIELD | Source: Ambulatory Visit

## 2022-07-06 ENCOUNTER — Inpatient Hospital Stay: Admit: 2022-07-06 | Payer: BLUE CROSS/BLUE SHIELD | Admitting: Orthopedic Surgery

## 2022-07-06 SURGERY — ARTHROPLASTY, KNEE, TOTAL
Anesthesia: Spinal | Site: Knee | Laterality: Left

## 2023-06-30 ENCOUNTER — Other Ambulatory Visit: Payer: Self-pay | Admitting: Orthopedic Surgery

## 2023-07-27 ENCOUNTER — Other Ambulatory Visit: Payer: Self-pay

## 2023-07-27 ENCOUNTER — Encounter
Admission: RE | Admit: 2023-07-27 | Discharge: 2023-07-27 | Disposition: A | Discharge status: Home / Self Care | Payer: MEDICAID | Source: Ambulatory Visit | Attending: Orthopedic Surgery | Admitting: Orthopedic Surgery

## 2023-07-27 VITALS — BP 121/75 | HR 72 | Temp 98.1°F | Ht 69.0 in | Wt 171.1 lb

## 2023-07-27 DIAGNOSIS — Z01818 Encounter for other preprocedural examination: Secondary | ICD-10-CM | POA: Insufficient documentation

## 2023-07-27 DIAGNOSIS — Z01812 Encounter for preprocedural laboratory examination: Secondary | ICD-10-CM

## 2023-07-27 DIAGNOSIS — E119 Type 2 diabetes mellitus without complications: Secondary | ICD-10-CM | POA: Diagnosis not present

## 2023-07-27 HISTORY — DX: Essential (primary) hypertension: I10

## 2023-07-27 LAB — COMPREHENSIVE METABOLIC PANEL
ALT: 20 U/L (ref 0–44)
AST: 21 U/L (ref 15–41)
Albumin: 4.2 g/dL (ref 3.5–5.0)
Alkaline Phosphatase: 66 U/L (ref 38–126)
Anion gap: 12 (ref 5–15)
BUN: 16 mg/dL (ref 6–20)
CO2: 22 mmol/L (ref 22–32)
Calcium: 9.6 mg/dL (ref 8.9–10.3)
Chloride: 101 mmol/L (ref 98–111)
Creatinine, Ser: 0.68 mg/dL (ref 0.61–1.24)
GFR, Estimated: >60 mL/min (ref >60)
Glucose, Bld: 142 mg/dL — ABNORMAL HIGH (ref 70–99)
Potassium: 4.5 mmol/L (ref 3.5–5.1)
Sodium: 135 mmol/L (ref 135–145)
Total Bilirubin: 0.9 mg/dL (ref 0.3–1.2)
Total Protein: 7.3 g/dL (ref 6.5–8.1)

## 2023-07-27 LAB — CBC WITH DIFFERENTIAL/PLATELET
Abs Immature Granulocytes: 0.04 10*3/uL (ref 0.00–0.07)
Basophils Absolute: 0 10*3/uL (ref 0.0–0.1)
Basophils Relative: 0 %
Eosinophils Absolute: 0.2 10*3/uL (ref 0.0–0.5)
Eosinophils Relative: 2 %
HCT: 45.1 % (ref 39.0–52.0)
Hemoglobin: 15.8 g/dL (ref 13.0–17.0)
Immature Granulocytes: 1 %
Lymphocytes Relative: 19 %
Lymphs Abs: 1.5 10*3/uL (ref 0.7–4.0)
MCH: 30.2 pg (ref 26.0–34.0)
MCHC: 35 g/dL (ref 30.0–36.0)
MCV: 86.2 fL (ref 80.0–100.0)
Monocytes Absolute: 0.9 10*3/uL (ref 0.1–1.0)
Monocytes Relative: 11 %
Neutro Abs: 5.3 10*3/uL (ref 1.7–7.7)
Neutrophils Relative %: 67 %
Platelets: 212 10*3/uL (ref 150–400)
RBC: 5.23 MIL/uL (ref 4.22–5.81)
RDW: 12.9 % (ref 11.5–15.5)
WBC: 7.9 10*3/uL (ref 4.0–10.5)
nRBC: 0 % (ref 0.0–0.2)

## 2023-07-27 LAB — URINALYSIS, ROUTINE W REFLEX MICROSCOPIC
Bilirubin Urine: NEGATIVE
Glucose, UA: NEGATIVE mg/dL
Hgb urine dipstick: NEGATIVE
Ketones, ur: 5 mg/dL — AB
Leukocytes,Ua: NEGATIVE
Nitrite: NEGATIVE
Protein, ur: NEGATIVE mg/dL
Specific Gravity, Urine: 1.018 (ref 1.005–1.030)
pH: 5 (ref 5.0–8.0)

## 2023-07-27 LAB — SURGICAL PCR SCREEN
MRSA, PCR: NEGATIVE
Staphylococcus aureus: NEGATIVE

## 2023-07-27 NOTE — Patient Instructions (Addendum)
Your procedure is scheduled on: Monday October 21  Report to the Registration Desk on the 1st floor of the [facility] To find out your arrival time, please call [phone] between 1PM - 3PM on: Friday October 18  If your arrival time is 6:00 am, do not arrive before that time as the [facility] entrance doors do not open until 6:00 am.  REMEMBER: Instructions that are not followed completely may result in serious medical risk, up to and including death; or upon the discretion of your surgeon and anesthesiologist your surgery may need to be rescheduled.  Do not eat food after midnight the night before surgery.  No gum chewing or hard candies.  You may however, drink WATER up to 2 hours before you are scheduled to arrive for your surgery. Do not drink anything within 2 hours of your scheduled arrival time.  In addition, your doctor has ordered for you to drink the provided:   Gatorade G2 Drinking this carbohydrate drink up to two hours before surgery helps to reduce insulin resistance and improve patient outcomes. Please complete drinking 2 hours before scheduled arrival time.  One week prior to surgery: Monday October 14  Stop Anti-inflammatories (NSAIDS) such as Advil, Aleve, Ibuprofen, Motrin, Naproxen, Naprosyn and Aspirin based products such as Excedrin, Goody's Powder, BC Powder.  Stop ANY OVER THE COUNTER supplements until after surgery.  You may however, continue to take Tylenol if needed for pain up until the day of surgery. meloxicam (MOBIC) hold 7 days prior to surgery, last dose Sunday October 13  lisinopril (ZESTRIL) hold the day of surgery, last dose Sunday October 20 naltrexone (DEPADE)  hold 7 days prior to surgery, last dose Sunday October 13 Continue taking all prescribed medications with the exception of the following:  **Follow guidelines for insulin and diabetes medications** metFORMIN (GLUCOPHAGE-XR) hold the day of surgery, last dose Sunday October  20 glipiZIDE (GLUCOTROL XL) hold the day of surgery, last dose Sunday October 20  Follow recommendations from Cardiologist or PCP regarding stopping blood thinners.  DO NOT TAKE ANY MEDICATIONS THE MORNING OF SURGERY.   No Alcohol for 24 hours before or after surgery.  No Smoking including e-cigarettes for 24 hours before surgery.  No chewable tobacco products for at least 6 hours before surgery.  No nicotine patches on the day of surgery.  Do not use any "recreational" drugs for at least a week (preferably 2 weeks) before your surgery.  Please be advised that the combination of cocaine and anesthesia may have negative outcomes, up to and including death. If you test positive for cocaine, your surgery will be cancelled.  On the morning of surgery brush your teeth with toothpaste and water, you may rinse your mouth with mouthwash if you wish. Do not swallow any toothpaste or mouthwash.  Use CHG Soap as directed on instruction sheet.  Do not wear jewelry, make-up, hairpins, clips or nail polish.  For welded (permanent) jewelry: bracelets, anklets, waist bands, etc.  Please have this removed prior to surgery.  If it is not removed, there is a chance that hospital personnel will need to cut it off on the day of surgery.  Do not wear lotions, powders, or perfumes.   Do not shave body hair from the neck down 48 hours before surgery.  Contact lenses, hearing aids and dentures may not be worn into surgery.  Do not bring valuables to the hospital. [facility] is not responsible for any missing/lost belongings or valuables.  Notify your doctor if there is any change in your medical condition (cold, fever, infection).  Wear comfortable clothing (specific to your surgery type) to the hospital.  After surgery, you can help prevent lung complications by doing breathing exercises.  Take deep breaths and cough every 1-2 hours. Your doctor may order a device called an Incentive Spirometer to  help you take deep breaths.  If you are being admitted to the hospital overnight, leave your suitcase in the car. After surgery it may be brought to your room.  In case of increased patient census, it may be necessary for you, the patient, to continue your postoperative care in the Same Day Surgery department.  If you are being discharged the day of surgery, you will not be allowed to drive home. You will need a responsible individual to drive you home and stay with you for 24 hours after surgery.   If you are taking public transportation, you will need to have a responsible individual with you.  Please call the Pre-admissions Testing Dept. at [phone] if you have any questions about these instructions.  Surgery Visitation Policy:  Patients having surgery or a procedure may have two visitors.  Children under the age of 38 must have an adult with them who is not the patient.  Inpatient Visitation:    Visiting hours are 7 a.m. to 8 p.m. Up to four visitors are allowed at one time in a patient room. The visitors may rotate out with other people during the day.  One visitor age 49 or older may stay with the patient overnight and must be in the room by 8 p.m.                   Pre-operative 5 CHG Bath Instructions   You can play a key role in reducing the risk of infection after surgery. Your skin needs to be as free of germs as possible. You can reduce the number of germs on your skin by washing with CHG (chlorhexidine gluconate) soap before surgery. CHG is an antiseptic soap that kills germs and continues to kill germs even after washing.   DO NOT use if you have an allergy to chlorhexidine/CHG or antibacterial soaps. If your skin becomes reddened or irritated, stop using the CHG and notify one of our RNs at [phone].   Please shower with the CHG soap starting 4 days before surgery using the following schedule:     Please keep in mind the following:  DO NOT  shave, including legs and underarms, starting the day of your first shower.   You may shave your face at any point before/day of surgery.  Place clean sheets on your bed the day you start using CHG soap. Use a clean washcloth (not used since being washed) for each shower. DO NOT sleep with pets once you start using the CHG.   CHG Shower Instructions:  If you choose to wash your hair and private area, wash first with your normal shampoo/soap.  After you use shampoo/soap, rinse your hair and body thoroughly to remove shampoo/soap residue.  Turn the water OFF and apply about 3 tablespoons (45 ml) of CHG soap to a CLEAN washcloth.  Apply CHG soap ONLY FROM YOUR NECK DOWN TO YOUR TOES (washing for 3-5 minutes)  DO NOT use CHG soap on face, private areas, open wounds, or sores.  Pay special attention to the area where your surgery is being performed.  If you are having back  surgery, having someone wash your back for you may be helpful. Wait 2 minutes after CHG soap is applied, then you may rinse off the CHG soap.  Pat dry with a clean towel  Put on clean clothes/pajamas   If you choose to wear lotion, please use ONLY the CHG-compatible lotions on the back of this paper.     Additional instructions for the day of surgery: DO NOT APPLY any lotions, deodorants, cologne, or perfumes.   Put on clean/comfortable clothes.  Brush your teeth.  Ask your nurse before applying any prescription medications to the skin.      CHG Compatible Lotions   Aveeno Moisturizing lotion  Cetaphil Moisturizing Cream  Cetaphil Moisturizing Lotion  Clairol Herbal Essence Moisturizing Lotion, Dry Skin  Clairol Herbal Essence Moisturizing Lotion, Extra Dry Skin  Clairol Herbal Essence Moisturizing Lotion, Normal Skin  Curel Age Defying Therapeutic Moisturizing Lotion with Alpha Hydroxy  Curel Extreme Care Body Lotion  Curel Soothing Hands Moisturizing Hand Lotion  Curel Therapeutic Moisturizing Cream,  Fragrance-Free  Curel Therapeutic Moisturizing Lotion, Fragrance-Free  Curel Therapeutic Moisturizing Lotion, Original Formula  Eucerin Daily Replenishing Lotion  Eucerin Dry Skin Therapy Plus Alpha Hydroxy Crme  Eucerin Dry Skin Therapy Plus Alpha Hydroxy Lotion  Eucerin Original Crme  Eucerin Original Lotion  Eucerin Plus Crme Eucerin Plus Lotion  Eucerin TriLipid Replenishing Lotion  Keri Anti-Bacterial Hand Lotion  Keri Deep Conditioning Original Lotion Dry Skin Formula Softly Scented  Keri Deep Conditioning Original Lotion, Fragrance Free Sensitive Skin Formula  Keri Lotion Fast Absorbing Fragrance Free Sensitive Skin Formula  Keri Lotion Fast Absorbing Softly Scented Dry Skin Formula  Keri Original Lotion  Keri Skin Renewal Lotion Keri Silky Smooth Lotion  Keri Silky Smooth Sensitive Skin Lotion  Nivea Body Creamy Conditioning Oil  Nivea Body Extra Enriched Lotion  Nivea Body Original Lotion  Nivea Body Sheer Moisturizing Lotion Nivea Crme  Nivea Skin Firming Lotion  NutraDerm 30 Skin Lotion  NutraDerm Skin Lotion  NutraDerm Therapeutic Skin Cream  NutraDerm Therapeutic Skin Lotion  ProShield Protective Hand Cream  Provon moisturizing lotion           How to Use an Incentive Spirometer  An incentive spirometer is a tool that measures how well you are filling your lungs with each breath. Learning to take long, deep breaths using this tool can help you keep your lungs clear and active. This may help to reverse or lessen your chance of developing breathing (pulmonary) problems, especially infection. You may be asked to use a spirometer: After a surgery. If you have a lung problem or a history of smoking. After a long period of time when you have been unable to move or be active. If the spirometer includes an indicator to show the highest number that you have reached, your health care provider or respiratory therapist will help you set a goal. Keep a log of your  progress as told by your health care provider. What are the risks? Breathing too quickly may cause dizziness or cause you to pass out. Take your time so you do not get dizzy or light-headed. If you are in pain, you may need to take pain medicine before doing incentive spirometry. It is harder to take a deep breath if you are having pain. How to use your incentive spirometer  Sit up on the edge of your bed or on a chair. Hold the incentive spirometer so that it is in an upright position. Before you use the spirometer, breathe  out normally. Place the mouthpiece in your mouth. Make sure your lips are closed tightly around it. Breathe in slowly and as deeply as you can through your mouth, causing the piston or the ball to rise toward the top of the chamber. Hold your breath for 3-5 seconds, or for as long as possible. If the spirometer includes a coach indicator, use this to guide you in breathing. Slow down your breathing if the indicator goes above the marked areas. Remove the mouthpiece from your mouth and breathe out normally. The piston or ball will return to the bottom of the chamber. Rest for a few seconds, then repeat the steps 10 or more times. Take your time and take a few normal breaths between deep breaths so that you do not get dizzy or light-headed. Do this every 1-2 hours when you are awake. If the spirometer includes a goal marker to show the highest number you have reached (best effort), use this as a goal to work toward during each repetition. After each set of 10 deep breaths, cough a few times. This will help to make sure that your lungs are clear. If you have an incision on your chest or abdomen from surgery, place a pillow or a rolled-up towel firmly against the incision when you cough. This can help to reduce pain while taking deep breaths and coughing. General tips When you are able to get out of bed: Walk around often. Continue to take deep breaths and cough in order to  clear your lungs. Keep using the incentive spirometer until your health care provider says it is okay to stop using it. If you have been in the hospital, you may be told to keep using the spirometer at home. Contact a health care provider if: You are having difficulty using the spirometer. You have trouble using the spirometer as often as instructed. Your pain medicine is not giving enough relief for you to use the spirometer as told. You have a fever. Get help right away if: You develop shortness of breath. You develop a cough with bloody mucus from the lungs. You have fluid or blood coming from an incision site after you cough. Summary An incentive spirometer is a tool that can help you learn to take long, deep breaths to keep your lungs clear and active. You may be asked to use a spirometer after a surgery, if you have a lung problem or a history of smoking, or if you have been inactive for a long period of time. Use your incentive spirometer as instructed every 1-2 hours while you are awake. If you have an incision on your chest or abdomen, place a pillow or a rolled-up towel firmly against your incision when you cough. This will help to reduce pain. Get help right away if you have shortness of breath, you cough up bloody mucus, or blood comes from your incision when you cough. This information is not intended to replace advice given to you by your health care provider. Make sure you discuss any questions you have with your health care provider. Document Revised: 12/24/2019 Document Reviewed: 12/24/2019 Elsevier Patient Education  2023 Else[employer] Inc.     Preoperative Educational Videos for Total Hip, Knee and Shoulder Replacements  To better prepare for surgery, please view our videos that explain the physical activity and discharge planning required to have the best surgical recovery at [facility].  [email or web address]  Questions? Call  [phone] or email [email or web address]

## 2023-08-08 ENCOUNTER — Ambulatory Visit: Payer: MEDICAID | Admitting: Registered Nurse

## 2023-08-08 ENCOUNTER — Other Ambulatory Visit: Payer: Self-pay

## 2023-08-08 ENCOUNTER — Observation Stay
Admission: RE | Admit: 2023-08-08 | Discharge: 2023-08-09 | Disposition: A | Discharge status: Home / Self Care | Payer: MEDICAID | Source: Ambulatory Visit | Attending: Orthopedic Surgery | Admitting: Orthopedic Surgery

## 2023-08-08 ENCOUNTER — Ambulatory Visit: Payer: MEDICAID | Admitting: Urgent Care

## 2023-08-08 ENCOUNTER — Encounter: Payer: Self-pay | Admitting: Orthopedic Surgery

## 2023-08-08 ENCOUNTER — Ambulatory Visit: Payer: MEDICAID

## 2023-08-08 ENCOUNTER — Encounter
Admission: RE | Disposition: A | Discharge status: Home / Self Care | Payer: Self-pay | Source: Ambulatory Visit | Attending: Orthopedic Surgery

## 2023-08-08 DIAGNOSIS — Z7984 Long term (current) use of oral hypoglycemic drugs: Secondary | ICD-10-CM | POA: Insufficient documentation

## 2023-08-08 DIAGNOSIS — Z79899 Other long term (current) drug therapy: Secondary | ICD-10-CM | POA: Insufficient documentation

## 2023-08-08 DIAGNOSIS — I10 Essential (primary) hypertension: Secondary | ICD-10-CM | POA: Insufficient documentation

## 2023-08-08 DIAGNOSIS — E119 Type 2 diabetes mellitus without complications: Secondary | ICD-10-CM | POA: Insufficient documentation

## 2023-08-08 DIAGNOSIS — Z01812 Encounter for preprocedural laboratory examination: Secondary | ICD-10-CM

## 2023-08-08 DIAGNOSIS — Z96652 Presence of left artificial knee joint: Principal | ICD-10-CM

## 2023-08-08 DIAGNOSIS — M1712 Unilateral primary osteoarthritis, left knee: Secondary | ICD-10-CM | POA: Diagnosis present

## 2023-08-08 HISTORY — PX: TOTAL KNEE ARTHROPLASTY: SHX125

## 2023-08-08 LAB — SYNOVIAL CELL COUNT + DIFF, W/ CRYSTALS
Eosinophils-Synovial: 0 %
Lymphocytes-Synovial Fld: 42 %
Monocyte-Macrophage-Synovial Fluid: 37 %
Neutrophil, Synovial: 21 %
WBC, Synovial: 259 /mm3 — ABNORMAL HIGH (ref 0–200)

## 2023-08-08 LAB — GLUCOSE, CAPILLARY
Glucose-Capillary: 182 mg/dL — ABNORMAL HIGH (ref 70–99)
Glucose-Capillary: 180 mg/dL — ABNORMAL HIGH (ref 70–99)
Glucose-Capillary: 276 mg/dL — ABNORMAL HIGH (ref 70–99)
Glucose-Capillary: 292 mg/dL — ABNORMAL HIGH (ref 70–99)

## 2023-08-08 SURGERY — ARTHROPLASTY, KNEE, TOTAL
Anesthesia: Spinal | Site: Knee | Laterality: Left

## 2023-08-08 MED ORDER — DOCUSATE SODIUM 100 MG PO CAPS
ORAL_CAPSULE | ORAL | Status: AC
  Filled 2023-08-08: qty 1

## 2023-08-08 MED ORDER — KETAMINE HCL 50 MG/5ML IJ SOSY
PREFILLED_SYRINGE | INTRAMUSCULAR | Status: AC
  Filled 2023-08-08: qty 5

## 2023-08-08 MED ORDER — HYDROCODONE-ACETAMINOPHEN 5-325 MG PO TABS
ORAL_TABLET | ORAL | Status: AC
  Filled 2023-08-08: qty 1

## 2023-08-08 MED ORDER — TRAMADOL HCL 50 MG PO TABS: 50.0000 mg | ORAL_TABLET | Freq: Four times a day (QID) | ORAL | Status: DC | PRN

## 2023-08-08 MED ORDER — METOCLOPRAMIDE HCL 5 MG PO TABS: 5.0000 mg | ORAL_TABLET | Freq: Three times a day (TID) | ORAL | Status: DC | PRN

## 2023-08-08 MED ORDER — SODIUM CHLORIDE (PF) 0.9 % IJ SOLN
INTRAMUSCULAR | Status: AC
  Filled 2023-08-08: qty 20

## 2023-08-08 MED ORDER — PHENYLEPHRINE HCL-NACL 20-0.9 MG/250ML-% IV SOLN
INTRAVENOUS | Status: AC
  Filled 2023-08-08: qty 250

## 2023-08-08 MED ORDER — LISINOPRIL 5 MG PO TABS
10.0000 mg | ORAL_TABLET | Freq: Every day | ORAL | Status: DC
  Administered 2023-08-09: 10 mg via ORAL

## 2023-08-08 MED ORDER — PHENYLEPHRINE HCL-NACL 20-0.9 MG/250ML-% IV SOLN
INTRAVENOUS | Status: DC | PRN
  Administered 2023-08-08: 20 ug/min via INTRAVENOUS

## 2023-08-08 MED ORDER — TRAZODONE HCL 100 MG PO TABS
100.0000 mg | ORAL_TABLET | Freq: Every day | ORAL | Status: DC
  Administered 2023-08-08: 100 mg via ORAL
  Filled 2023-08-08: qty 1

## 2023-08-08 MED ORDER — MENTHOL 3 MG MT LOZG: 1.0000 | LOZENGE | OROMUCOSAL | Status: DC | PRN

## 2023-08-08 MED ORDER — ARIPIPRAZOLE 10 MG PO TABS
10.0000 mg | ORAL_TABLET | Freq: Every day | ORAL | Status: DC
  Filled 2023-08-08: qty 1

## 2023-08-08 MED ORDER — KETOROLAC TROMETHAMINE 15 MG/ML IJ SOLN
INTRAMUSCULAR | Status: AC
  Filled 2023-08-08: qty 1

## 2023-08-08 MED ORDER — SURGIRINSE WOUND IRRIGATION SYSTEM - OPTIME
TOPICAL | Status: DC | PRN
  Administered 2023-08-08: 450 mL via TOPICAL

## 2023-08-08 MED ORDER — MORPHINE SULFATE (PF) 4 MG/ML IV SOLN
INTRAVENOUS | Status: AC
  Filled 2023-08-08: qty 1

## 2023-08-08 MED ORDER — GLYCOPYRROLATE 0.2 MG/ML IJ SOLN
INTRAMUSCULAR | Status: DC | PRN
  Administered 2023-08-08 (×2): .1 mg via INTRAVENOUS

## 2023-08-08 MED ORDER — PHENOL 1.4 % MT LIQD: 1.0000 | OROMUCOSAL | Status: DC | PRN

## 2023-08-08 MED ORDER — CEFAZOLIN SODIUM-DEXTROSE 2-4 GM/100ML-% IV SOLN
INTRAVENOUS | Status: AC
Start: 2023-08-08 — End: ?
  Filled 2023-08-08: qty 100

## 2023-08-08 MED ORDER — TRANEXAMIC ACID-NACL 1000-0.7 MG/100ML-% IV SOLN
INTRAVENOUS | Status: AC
  Filled 2023-08-08: qty 100

## 2023-08-08 MED ORDER — FENTANYL CITRATE (PF) 100 MCG/2ML IJ SOLN: 25.0000 ug | INTRAMUSCULAR | Status: DC | PRN

## 2023-08-08 MED ORDER — BUPIVACAINE LIPOSOME 1.3 % IJ SUSP
INTRAMUSCULAR | Status: AC
  Filled 2023-08-08: qty 20

## 2023-08-08 MED ORDER — PROPOFOL 500 MG/50ML IV EMUL
INTRAVENOUS | Status: DC | PRN
  Administered 2023-08-08: 100 ug/kg/min via INTRAVENOUS
  Administered 2023-08-08: 40 mg via INTRAVENOUS
  Administered 2023-08-08 (×4): 20 mg via INTRAVENOUS

## 2023-08-08 MED ORDER — CHLORHEXIDINE GLUCONATE 0.12 % MT SOLN
15.0000 mL | Freq: Once | OROMUCOSAL | Status: AC
  Administered 2023-08-08: 15 mL via OROMUCOSAL

## 2023-08-08 MED ORDER — INSULIN ASPART 100 UNIT/ML IJ SOLN
0.0000 [IU] | Freq: Every day | INTRAMUSCULAR | Status: DC
  Administered 2023-08-08: 3 [IU] via SUBCUTANEOUS

## 2023-08-08 MED ORDER — INSULIN ASPART 100 UNIT/ML IJ SOLN
INTRAMUSCULAR | Status: AC
  Filled 2023-08-08: qty 1

## 2023-08-08 MED ORDER — GABAPENTIN 300 MG PO CAPS
ORAL_CAPSULE | ORAL | Status: AC
  Filled 2023-08-08: qty 1

## 2023-08-08 MED ORDER — PANTOPRAZOLE SODIUM 40 MG PO TBEC
40.0000 mg | DELAYED_RELEASE_TABLET | Freq: Every day | ORAL | Status: DC
  Administered 2023-08-08 – 2023-08-09 (×2): 40 mg via ORAL

## 2023-08-08 MED ORDER — BUPIVACAINE HCL (PF) 0.5 % IJ SOLN
INTRAMUSCULAR | Status: DC | PRN
  Administered 2023-08-08: 2.8 mL via INTRATHECAL

## 2023-08-08 MED ORDER — TRANEXAMIC ACID-NACL 1000-0.7 MG/100ML-% IV SOLN
1000.0000 mg | INTRAVENOUS | Status: AC
  Administered 2023-08-08 (×2): 1000 mg via INTRAVENOUS

## 2023-08-08 MED ORDER — MIDAZOLAM HCL 2 MG/2ML IJ SOLN
INTRAMUSCULAR | Status: AC
  Filled 2023-08-08: qty 2

## 2023-08-08 MED ORDER — HYDROCODONE-ACETAMINOPHEN 5-325 MG PO TABS
1.0000 | ORAL_TABLET | ORAL | Status: DC | PRN
  Administered 2023-08-08 – 2023-08-09 (×4): 1 via ORAL

## 2023-08-08 MED ORDER — ACETAMINOPHEN 10 MG/ML IV SOLN
INTRAVENOUS | Status: DC | PRN
  Administered 2023-08-08: 1000 mg via INTRAVENOUS

## 2023-08-08 MED ORDER — METOCLOPRAMIDE HCL 5 MG/ML IJ SOLN: 5.0000 mg | Freq: Three times a day (TID) | INTRAMUSCULAR | Status: DC | PRN

## 2023-08-08 MED ORDER — CHLORHEXIDINE GLUCONATE 0.12 % MT SOLN
OROMUCOSAL | Status: AC
  Filled 2023-08-08: qty 15

## 2023-08-08 MED ORDER — CEFAZOLIN SODIUM-DEXTROSE 2-4 GM/100ML-% IV SOLN
INTRAVENOUS | Status: AC
  Filled 2023-08-08: qty 100

## 2023-08-08 MED ORDER — FENTANYL CITRATE (PF) 100 MCG/2ML IJ SOLN
INTRAMUSCULAR | Status: AC
  Filled 2023-08-08: qty 2

## 2023-08-08 MED ORDER — GLYCOPYRROLATE 0.2 MG/ML IJ SOLN
INTRAMUSCULAR | Status: AC
  Filled 2023-08-08: qty 1

## 2023-08-08 MED ORDER — KETOROLAC TROMETHAMINE 30 MG/ML IJ SOLN
INTRAMUSCULAR | Status: AC
  Filled 2023-08-08: qty 1

## 2023-08-08 MED ORDER — PROPOFOL 1000 MG/100ML IV EMUL
INTRAVENOUS | Status: AC
  Filled 2023-08-08: qty 100

## 2023-08-08 MED ORDER — ACETAMINOPHEN 500 MG PO TABS
1000.0000 mg | ORAL_TABLET | Freq: Three times a day (TID) | ORAL | Status: DC
  Administered 2023-08-08 – 2023-08-09 (×2): 1000 mg via ORAL

## 2023-08-08 MED ORDER — NALTREXONE HCL 50 MG PO TABS: 50.0000 mg | ORAL_TABLET | Freq: Every day | ORAL | Status: DC

## 2023-08-08 MED ORDER — BUPIVACAINE HCL (PF) 0.5 % IJ SOLN
INTRAMUSCULAR | Status: AC
  Filled 2023-08-08: qty 10

## 2023-08-08 MED ORDER — GABAPENTIN 300 MG PO CAPS
300.0000 mg | ORAL_CAPSULE | Freq: Three times a day (TID) | ORAL | Status: DC
  Administered 2023-08-08 – 2023-08-09 (×2): 300 mg via ORAL

## 2023-08-08 MED ORDER — ACETAMINOPHEN 500 MG PO TABS
ORAL_TABLET | ORAL | Status: AC
  Filled 2023-08-08: qty 2

## 2023-08-08 MED ORDER — KETAMINE HCL 50 MG/5ML IJ SOSY
PREFILLED_SYRINGE | INTRAMUSCULAR | Status: DC | PRN
  Administered 2023-08-08: 30 mg via INTRAVENOUS
  Administered 2023-08-08: 20 mg via INTRAVENOUS

## 2023-08-08 MED ORDER — ENOXAPARIN SODIUM 30 MG/0.3ML IJ SOSY
30.0000 mg | PREFILLED_SYRINGE | Freq: Two times a day (BID) | INTRAMUSCULAR | Status: DC
  Administered 2023-08-09: 30 mg via SUBCUTANEOUS

## 2023-08-08 MED ORDER — DEXAMETHASONE SODIUM PHOSPHATE 10 MG/ML IJ SOLN
8.0000 mg | Freq: Once | INTRAMUSCULAR | Status: AC
  Administered 2023-08-08: 10 mg via INTRAVENOUS

## 2023-08-08 MED ORDER — ONDANSETRON HCL 4 MG PO TABS: 4.0000 mg | ORAL_TABLET | Freq: Four times a day (QID) | ORAL | Status: DC | PRN

## 2023-08-08 MED ORDER — INSULIN ASPART 100 UNIT/ML IJ SOLN
0.0000 [IU] | Freq: Three times a day (TID) | INTRAMUSCULAR | Status: DC
Start: 2023-08-08 — End: 2023-08-09
  Administered 2023-08-08: 8 [IU] via SUBCUTANEOUS
  Administered 2023-08-09: 3 [IU] via SUBCUTANEOUS
  Administered 2023-08-09: 5 [IU] via SUBCUTANEOUS

## 2023-08-08 MED ORDER — PANTOPRAZOLE SODIUM 40 MG PO TBEC
DELAYED_RELEASE_TABLET | ORAL | Status: AC
  Filled 2023-08-08: qty 1

## 2023-08-08 MED ORDER — ACETAMINOPHEN 500 MG PO TABS: 1000.0000 mg | ORAL_TABLET | Freq: Three times a day (TID) | ORAL | Status: DC

## 2023-08-08 MED ORDER — CEFAZOLIN SODIUM-DEXTROSE 2-4 GM/100ML-% IV SOLN
2.0000 g | Freq: Four times a day (QID) | INTRAVENOUS | Status: AC
  Administered 2023-08-08 (×2): 2 g via INTRAVENOUS

## 2023-08-08 MED ORDER — ACETAMINOPHEN 10 MG/ML IV SOLN
INTRAVENOUS | Status: AC
  Filled 2023-08-08: qty 100

## 2023-08-08 MED ORDER — SODIUM CHLORIDE (PF) 0.9 % IJ SOLN
INTRAMUSCULAR | Status: DC | PRN
  Administered 2023-08-08: 70 mL via INTRAMUSCULAR

## 2023-08-08 MED ORDER — SODIUM CHLORIDE 0.9 % IV SOLN: INTRAVENOUS | Status: DC

## 2023-08-08 MED ORDER — ONDANSETRON HCL 4 MG/2ML IJ SOLN
INTRAMUSCULAR | Status: DC | PRN
  Administered 2023-08-08: 4 mg via INTRAVENOUS

## 2023-08-08 MED ORDER — MORPHINE SULFATE (PF) 4 MG/ML IV SOLN
0.5000 mg | INTRAVENOUS | Status: DC | PRN
Start: 2023-08-08 — End: 2023-08-09
  Administered 2023-08-08: 1 mg via INTRAVENOUS

## 2023-08-08 MED ORDER — ONDANSETRON HCL 4 MG/2ML IJ SOLN: 4.0000 mg | Freq: Four times a day (QID) | INTRAMUSCULAR | Status: DC | PRN

## 2023-08-08 MED ORDER — DOCUSATE SODIUM 100 MG PO CAPS
100.0000 mg | ORAL_CAPSULE | Freq: Two times a day (BID) | ORAL | Status: DC
  Administered 2023-08-08 – 2023-08-09 (×2): 100 mg via ORAL

## 2023-08-08 MED ORDER — SODIUM CHLORIDE 0.9 % IR SOLN
Status: DC | PRN
  Administered 2023-08-08: 3000 mL

## 2023-08-08 MED ORDER — ORAL CARE MOUTH RINSE: 15.0000 mL | Freq: Once | OROMUCOSAL | Status: AC

## 2023-08-08 MED ORDER — BUPIVACAINE HCL (PF) 0.25 % IJ SOLN
INTRAMUSCULAR | Status: AC
  Filled 2023-08-08: qty 30

## 2023-08-08 MED ORDER — EPINEPHRINE PF 1 MG/ML IJ SOLN
INTRAMUSCULAR | Status: AC
  Filled 2023-08-08: qty 1

## 2023-08-08 MED ORDER — MIDAZOLAM HCL 5 MG/5ML IJ SOLN
INTRAMUSCULAR | Status: DC | PRN
  Administered 2023-08-08: 2 mg via INTRAVENOUS

## 2023-08-08 MED ORDER — KETOROLAC TROMETHAMINE 15 MG/ML IJ SOLN
7.5000 mg | Freq: Four times a day (QID) | INTRAMUSCULAR | Status: AC
  Administered 2023-08-08 – 2023-08-09 (×4): 7.5 mg via INTRAVENOUS

## 2023-08-08 MED ORDER — FENTANYL CITRATE (PF) 100 MCG/2ML IJ SOLN
INTRAMUSCULAR | Status: DC | PRN
  Administered 2023-08-08 (×2): 25 ug via INTRAVENOUS

## 2023-08-08 MED ORDER — TOPIRAMATE 25 MG PO TABS
25.0000 mg | ORAL_TABLET | Freq: Two times a day (BID) | ORAL | Status: DC
  Administered 2023-08-08: 25 mg via ORAL
  Filled 2023-08-08 (×2): qty 1

## 2023-08-08 MED ORDER — CEFAZOLIN SODIUM-DEXTROSE 2-4 GM/100ML-% IV SOLN
2.0000 g | INTRAVENOUS | Status: AC
  Administered 2023-08-08: 2 g via INTRAVENOUS

## 2023-08-08 SURGICAL SUPPLY — 78 items
ADH SKN CLS APL DERMABOND .7 (GAUZE/BANDAGES/DRESSINGS) ×1
APL PRP STRL LF DISP 70% ISPRP (MISCELLANEOUS) ×2
BLADE PATELLA W-PILOT HOLE 35 (BLADE) IMPLANT
BLADE SAGITTAL AGGR TOOTH XLG (BLADE) IMPLANT
BLADE SAW SAG 25X90X1.19 (BLADE) ×1 IMPLANT
BLADE SAW SAG 29X58X.64 (BLADE) ×1 IMPLANT
BOWL CEMENT MIX W/ADAPTER (MISCELLANEOUS) ×1 IMPLANT
CHLORAPREP W/TINT 26 (MISCELLANEOUS) ×2 IMPLANT
COMP FEM KNEE STD PS 7 LT (Joint) ×1 IMPLANT
COMP PATELLA PEG 3 32 (Joint) ×1 IMPLANT
COMP TIB KNEE PS 0D LT (Joint) ×1 IMPLANT
COMPONENT FEM KNEE STD PS 7 LT (Joint) IMPLANT
COMPONENT PATELLA PEG 3 32 (Joint) IMPLANT
COMPONENT TIB KNEE PS 0D LT (Joint) IMPLANT
COOLER POLAR GLACIER W/PUMP (MISCELLANEOUS) ×1 IMPLANT
CUFF TOURN SGL QUICK 24 (TOURNIQUET CUFF) ×1
CUFF TOURN SGL QUICK 30 (TOURNIQUET CUFF)
CUFF TRNQT CYL 24X4X16.5-23 (TOURNIQUET CUFF) IMPLANT
CUFF TRNQT CYL 30X4X21-28X (TOURNIQUET CUFF) IMPLANT
DERMABOND ADVANCED .7 DNX12 (GAUZE/BANDAGES/DRESSINGS) ×1 IMPLANT
DRAPE INCISE IOBAN 66X60 STRL (DRAPES) IMPLANT
DRAPE SHEET LG 3/4 BI-LAMINATE (DRAPES) ×1 IMPLANT
DRSG MEPILEX SACRM 8.7X9.8 (GAUZE/BANDAGES/DRESSINGS) ×1 IMPLANT
DRSG OPSITE POSTOP 4X10 (GAUZE/BANDAGES/DRESSINGS) IMPLANT
DRSG OPSITE POSTOP 4X8 (GAUZE/BANDAGES/DRESSINGS) IMPLANT
ELECT REM PT RETURN 9FT ADLT (ELECTROSURGICAL) ×1
ELECTRODE REM PT RTRN 9FT ADLT (ELECTROSURGICAL) ×1 IMPLANT
GLOVE BIO SURGEON STRL SZ8 (GLOVE) ×1 IMPLANT
GLOVE BIOGEL PI IND STRL 8 (GLOVE) ×1 IMPLANT
GLOVE PI ORTHO PRO STRL 7.5 (GLOVE) ×2 IMPLANT
GLOVE PI ORTHO PRO STRL SZ8 (GLOVE) ×2 IMPLANT
GLOVE SURG SYN 7.5 E (GLOVE) ×1 IMPLANT
GLOVE SURG SYN 7.5 PF PI (GLOVE) ×1 IMPLANT
GOWN SRG XL LVL 3 NONREINFORCE (GOWNS) ×1 IMPLANT
GOWN STRL NON-REIN TWL XL LVL3 (GOWNS) ×1
GOWN STRL REUS W/ TWL LRG LVL3 (GOWN DISPOSABLE) ×1 IMPLANT
GOWN STRL REUS W/ TWL XL LVL3 (GOWN DISPOSABLE) ×1 IMPLANT
GOWN STRL REUS W/TWL LRG LVL3 (GOWN DISPOSABLE) ×1
GOWN STRL REUS W/TWL XL LVL3 (GOWN DISPOSABLE) ×1
HANDLE YANKAUER SUCT OPEN TIP (MISCELLANEOUS) ×1 IMPLANT
HOOD PEEL AWAY T7 (MISCELLANEOUS) ×2 IMPLANT
IV NS IRRIG 3000ML ARTHROMATIC (IV SOLUTION) ×1 IMPLANT
KIT TURNOVER KIT A (KITS) ×1 IMPLANT
MANIFOLD NEPTUNE II (INSTRUMENTS) ×1 IMPLANT
MARKER SKIN DUAL TIP RULER LAB (MISCELLANEOUS) ×1 IMPLANT
MAT ABSORB FLUID 56X50 GRAY (MISCELLANEOUS) ×1 IMPLANT
NDL FILTER BLUNT 18X1 1/2 (NEEDLE) ×1 IMPLANT
NDL HYPO 21X1.5 SAFETY (NEEDLE) ×1 IMPLANT
NDL SAFETY ECLIPSE 18X1.5 (NEEDLE) ×1 IMPLANT
NEEDLE FILTER BLUNT 18X1 1/2 (NEEDLE) ×1 IMPLANT
NEEDLE HYPO 21X1.5 SAFETY (NEEDLE) ×1 IMPLANT
PACK TOTAL KNEE (MISCELLANEOUS) ×1 IMPLANT
PAD ARMBOARD 7.5X6 YLW CONV (MISCELLANEOUS) ×3 IMPLANT
PAD WRAPON POLAR KNEE (MISCELLANEOUS) ×1 IMPLANT
PENCIL SMOKE EVACUATOR (MISCELLANEOUS) ×1 IMPLANT
PIN DRILL HDLS TROCAR 75 4PK (PIN) IMPLANT
PULSAVAC PLUS IRRIG FAN TIP (DISPOSABLE) ×1
SCREW FEMALE HEX FIX 25X2.5 (ORTHOPEDIC DISPOSABLE SUPPLIES) IMPLANT
SCREW HEX HEADED 3.5X27 DISP (ORTHOPEDIC DISPOSABLE SUPPLIES) IMPLANT
SLEEVE SCD COMPRESS KNEE MED (STOCKING) ×1 IMPLANT
SOLUTION IRRIG SURGIPHOR (IV SOLUTION) ×1 IMPLANT
SPONGE T-LAP 18X18 ~~LOC~~+RFID (SPONGE) IMPLANT
STEM TIBIAL SZ6-7 E-F10 (Stem) IMPLANT
SUT DVC 2 QUILL PDO T11 36X36 (SUTURE) ×1 IMPLANT
SUT QUILL MONODERM 3-0 PS-2 (SUTURE) ×1 IMPLANT
SUT VIC AB 0 CT1 36 (SUTURE) ×1 IMPLANT
SUT VIC AB 2-0 CT2 27 (SUTURE) ×2 IMPLANT
SUT VICRYL 1-0 27IN ABS (SUTURE) ×1
SUTURE VICRYL 1-0 27IN ABS (SUTURE) ×1 IMPLANT
SYR 30ML LL (SYRINGE) ×2 IMPLANT
SYR TB 1ML LL NO SAFETY (SYRINGE) ×1 IMPLANT
TAPE CLOTH 3X10 WHT NS LF (GAUZE/BANDAGES/DRESSINGS) ×1 IMPLANT
TIP FAN IRRIG PULSAVAC PLUS (DISPOSABLE) ×1 IMPLANT
TOWEL OR 17X26 4PK STRL BLUE (TOWEL DISPOSABLE) IMPLANT
TRAP FLUID SMOKE EVACUATOR (MISCELLANEOUS) ×1 IMPLANT
WATER STERILE IRR 1000ML POUR (IV SOLUTION) ×1 IMPLANT
WRAPON POLAR PAD KNEE (MISCELLANEOUS) ×1
patella reamer blade IMPLANT

## 2023-08-08 NOTE — Progress Notes (Signed)
Patient awake/alert x4.  Upon arrival to pacu bladder scan completed:  , per orders I &O catheter placed: return of clear yellow urine.  Patient tolerated procedure.

## 2023-08-08 NOTE — Op Note (Signed)
Patient Name: Clarence Cook  WUJ:[id number]  Pre-Operative Diagnosis: Left knee Osteoarthritis  Post-Operative Diagnosis: (same)  Procedure: Left Total Knee Arthroplasty  Components/Implants: Femur: Persona PPS #7 CR   Tibia: Size F OsseoTi pressfit  Poly: 10mm MC  Patella: 32x40mm osseoti pressfit 3 peg  Femoral Valgus Cut Angle: 5 degrees  Distal Femoral Re-cut: none  Patella Resurfacing: yes   Date of Surgery: 08/08/2023  Surgeon: Reinaldo Berber MD  Assistant: Amador Cunas (present and scrubbed throughout the case, critical for assistance with exposure, retraction, instrumentation, and closure); Minor PAS   Anesthesiologist: Karlton Lemon  Anesthesia: Spinal   Tourniquet Time: 79 min  EBL: 75cc  IVF: 900cc  Complications: None   Brief history: The patient is a 57 year old male with a history of osteoarthritis of the left knee with pain limiting their range of motion and activities of daily living, which has failed multiple attempts at conservative therapy.  The risks and benefits of total knee arthroplasty as definitive surgical treatment were discussed with the patient, who opted to proceed with the operation.  After outpatient medical clearance and optimization was completed the patient was admitted to [facility] for the procedure.  All preoperative films were reviewed and an appropriate surgical plan was made prior to surgery. Preoperative range of motion was 5 to 110 with a 5 degree flexion contracture. The patient was identified as having a varus alignment.   Description of procedure: The patient was brought to the operating room where laterality was confirmed by all those present to be the left side.   Spinal anesthesia was administered and the patient received an intravenous dose of antibiotics for surgical prophylaxis and a dose of tranexamic acid.  Patient is positioned supine on the operating room table with all bony prominences well-padded.  A  well-padded tourniquet was applied to the left thigh.  The knee was then prepped and draped in usual sterile fashion with multiple layers of adhesive and nonadhesive drapes.  All of those present in the operating room participated in a surgical timeout laterality and patient were confirmed.   An Esmarch was wrapped around the extremity and the leg was elevated and the knee flexed.  The tourniquet was inflated to a pressure of 275 mmHg. The Esmarch was removed and the leg was brought down to full extension.  The patella and tibial tubercle identified and outlined using a marking pen and a midline skin incision was made with a knife carried through the subcutaneous tissue down to the extensor retinaculum in line with the prior incision distally from the previous surgery.  At the inferior aspect of the incision there was an area of fullness which was identified prior to surgery there was no erythema and the patient had no systemic symptoms concerning for any infection.  Once cutting through the skin and area of thick white viscous fluid and granulation tissue was encountered.  This was superficial to the retinaculum at the inferior aspect of the incision under prior scar tissue from the previous surgery.  This area was carefully debrided and all of the liquid and tissue was sent to the lab for evaluation.  Under Gram stain and frozen evaluation there were no signs of active or acute inflammation with minimal neutrophils and negative Gram stain.  This area was then irrigated carefully and the instruments which were used for this initial part of the case were removed from the field and a new cover was placed over the Mayo stand.  The entire team changed  gloves while waiting for the results to come back.  The knee was then aspirated sterilely and shown to have normal-appearing synovial fluid which was sent to the lab and shown to have a cell count of 259 with only 21% neutrophils.  The decision was made to proceed with  the surgery with a plan for extended antibiotic prophylaxis postoperatively.  After exposure of the extensor mechanism the medial parapatellar arthrotomy was performed with a scalpel and electrocautery extending down medial and distal to the tibial tubercle taking care to avoid incising the patellar tendon.   A standard medial release was performed over the proximal tibia.  The knee was brought into extension in order to excise the fat pad taking care not to damage the patella tendon.  The superior soft tissue was removed from the anterior surface of the distal femur to visualize for the procedure.  The knee was then brought into flexion with the patella subluxed laterally and subluxing the tibia anteriorly.  The ACL was found to be absent with osteophyte overgrowth and scar tissue which was removed from the proximal surface of the tibia to fully expose. The PCL was found to be intact and was preserved.  An extramedullary tibial cutting guide was then applied to the leg with a spring-loaded ankle clamp placed around the distal tibia just above the malleoli the angulation of the guide was adjusted to give some posterior slope in the tibial resection with an appropriate varus/valgus alignment.  The resection guide was then pinned to the proximal tibia and the proximal tibial surface was resected with an oscillating saw.  Careful attention was paid to ensure the blade did not disrupt any of the soft tissues including any lateral or medial ligament.  Attention was then turned to the femur, with the knee slightly flexed a opening drill was used to enter the medullary canal of the femur.  After removing the drill marrow was suctioned out to decompress the distal femur.  An intramedullary femoral guide was then inserted into the drill hole and the alignment guide was seated firmly against the distal end of the medial femoral condyle.  The distal femoral cutting guide was then attached and pinned securely to the  anterior surface of the femur and the intramedullary rod and alignment guide was removed.  Distal femur resection was then performed with an oscillating saw with retractors protecting medial and laterally.   The distal cutting block was then removed and the extension gap was checked with a spacer.  Extension gap was found to be appropriately sized to accommodate the spacer block.   The femoral sizing guide was then placed securely into the posterior condyles of the femur and the femoral size was measured and determined to be 7.  The size 7; 4-in-1 cutting guide was placed in position and secured with 2 pins.  The anterior posterior and chamfer resections were then performed with an oscillating saw.  Bony fragments and osteophytes were then removed.  Using a lamina spreader the posterior medial and lateral condyles were checked for additional osteophytes and posterior soft tissue remnants.  Any remaining meniscus was removed at this time.  Periarticular injection was performed in the meniscal rims and posterior capsule with aspiration performed to ensure no intravascular injection.   The tibia was then exposed and the tibial trial was pinned onto the plateau after confirming appropriate orientation and rotation.  Using the drill bushing the tibia was prepared to the appropriate drill depth and the 4 spike holes  were also drilled.  Tibial broach impactor was then driven through the punch guide using a mallet.  The femoral trial component was then inserted onto the femur. A trial tibial polyethylene bearing was then placed and the knee was reduced.  The knee achieved full extension with no hyperextension and was found to be balanced in flexion and extension with the trials in place.  The knee was then brought into full extension the patella was everted and held with 2 Kocher clamps.  The articular surface of the patella was then resected with an patella reamer and saw after careful measurement with a caliper.   The patella was then prepared with the drill guide and a trial patella was placed.  The knee was then taken through range of motion and it was found that the patella articulated appropriately with the trochlea and good patellofemoral motion without subluxation.    The correct final components for implantation were confirmed and opened by the circulator nurse.  The knee joint space was carefully irrigated to remove all bony debris.  The tibia was then reexposed and the tibial component was impacted into place with good fit and coverage and a tight fit.  The femoral component was also impacted into place with a snug fit and good bony coverage.  The real 10 mm polyethylene component was then inserted and locked with the locking gun.  The patella was everted and the new patella component was pressed into place with good coverage. At this time the periarticular injection cocktail was placed in the soft tissues surrounding the knee.  The knee was taken through range of motion and found to be stable with good balance and appropriate patella tracking.  The knee was then irrigated with copious amount of normal saline via pulsatile lavage to remove all loose bodies and other debris.  The knee was then irrigated with surgiphor betadine based wash and reirrigated with saline.  The tourniquet was then dropped and all bleeding vessels were identified and coagulated.  The arthrotomy was approximated with #1 Vicryl and closed with #2 Quill suture.  The knee was brought into slight flexion and the subcutaneous tissues were closed with 0 Vicryl, 2-0 Vicryl and a running subcuticular 3-0 monoderm quil suture.  Skin was then glued with Dermabond.  A sterile adhesive dressing was then placed along with a sequential compression device to the calf, a Ted stocking, and a cryotherapy cuff.   Sponge, needle, and Lap counts were all correct at the end of the case.   The patient was transferred off of the operating room table to a  hospital bed, good pulses were found distally on the operative side.  The patient was transferred to the recovery room in stable condition.   The operative findings including the likely granuloma and concern for potential infection was relayed to the family and the patient be placed on extended oral antibiotics postoperatively which they were made aware of.  All questions were answered.

## 2023-08-08 NOTE — Anesthesia Preprocedure Evaluation (Signed)
Anesthesia Evaluation  Patient identified by MRN, date of birth, ID band Patient awake    Reviewed: Allergy & Precautions, H&P , NPO status , Patient's Chart, lab work & pertinent test results, reviewed documented beta blocker date and time   History of Anesthesia Complications Negative for: history of anesthetic complications  Airway Mallampati: II  TM Distance: >3 FB Neck ROM: full    Dental  (+) Dental Advidsory Given, Chipped, Teeth Intact   Pulmonary neg shortness of breath, Continuous Positive Airway Pressure Ventilation neg sleep apnea, COPD (mild, no inhaler), neg recent URI, Patient abstained from smoking., former smoker   Pulmonary exam normal breath sounds clear to auscultation       Cardiovascular Exercise Tolerance: Good hypertension, (-) angina (-) Past MI and (-) Cardiac Stents Normal cardiovascular exam(-) dysrhythmias (-) Valvular Problems/Murmurs Rhythm:regular Rate:Normal     Neuro/Psych negative neurological ROS  negative psych ROS   GI/Hepatic negative GI ROS, Neg liver ROS,,,  Endo/Other  diabetes    Renal/GU negative Renal ROS  negative genitourinary   Musculoskeletal   Abdominal   Peds  Hematology negative hematology ROS (+)   Anesthesia Other Findings Past Medical History: 1989: Brain injury (HCC) No date: Diabetes (HCC) No date: Hypertension   Reproductive/Obstetrics negative OB ROS                             Anesthesia Physical Anesthesia Plan  ASA: 2  Anesthesia Plan: Spinal   Post-op Pain Management:    Induction: Intravenous  PONV Risk Score and Plan: 1 and Propofol infusion and TIVA  Airway Management Planned: Natural Airway and Nasal Cannula  Additional Equipment:   Intra-op Plan:   Post-operative Plan:   Informed Consent: I have reviewed the patients History and Physical, chart, labs and discussed the procedure including the risks,  benefits and alternatives for the proposed anesthesia with the patient or authorized representative who has indicated his/her understanding and acceptance.     Dental Advisory Given  Plan Discussed with: Anesthesiologist, CRNA and Surgeon  Anesthesia Plan Comments:         Anesthesia Quick Evaluation

## 2023-08-08 NOTE — Anesthesia Procedure Notes (Signed)
Spinal  Patient location during procedure: OR Start time: 08/08/2023 10:55 AM Reason for block: surgical anesthesia Staffing Performed: resident/CRNA  Performed by: Lily Lovings, CRNA Authorized by: Lenard Simmer, MD   Preanesthetic Checklist Completed: patient identified, IV checked, site marked, risks and benefits discussed, surgical consent, monitors and equipment checked, pre-op evaluation and timeout performed Spinal Block Patient position: sitting Prep: Betadine Patient monitoring: heart rate, continuous pulse ox, blood pressure and cardiac monitor Approach: midline Location: L4-5 Injection technique: single-shot Needle Needle type: Whitacre and Introducer  Needle gauge: 24 G Needle length: 9 cm Assessment Events: CSF return Additional Notes Negative paresthesia. Negative blood return. Positive free-flowing CSF. Expiration date of kit checked and confirmed. Patient tolerated procedure well, without complications.

## 2023-08-08 NOTE — Evaluation (Signed)
Physical Therapy Evaluation Patient Details Name: Clarence Cook MRN: [id number] DOB: 1966-09-27 Today's Date: 08/08/2023  History of Present Illness  Pt is a 57 y.o. male s/p L TKA secondary to OA 08/08/23.  PMH includes COPD, CPAP, htn, DM, brain injury, back sx, neck sx, R wrist sx, L fx sx.  Clinical Impression  Prior to surgery, pt was ambulatory (uses SPC in camper and RW vs manual w/c outside of home); lives alone in a camper; pt's sister lives out of town and pt's mom lives in [place].  4-5/10 L knee pain throughout session.  Tolerated LE ex's fairly well (in bed) with assist as needed.  Currently pt is min assist with bed mobility and SBA with sitting balance.  Deferred transfers/ambulation d/t pt demonstrating B LE weakness and sensation impairments (safety concerns with mobility)--nurse aware--anticipate d/t spinal anesthesia still wearing off.  Pt would currently benefit from skilled PT to address noted impairments and functional limitations (see below for any additional details).  Upon hospital discharge, pt would benefit from ongoing therapy.     If plan is discharge home, recommend the following: A little help with walking and/or transfers;A little help with bathing/dressing/bathroom;Assistance with cooking/housework;Assist for transportation;Help with stairs or ramp for entrance   Can travel by private vehicle    Yes    Equipment Recommendations Rolling walker (2 wheels);BSC/3in1  Recommendations for Other Services       Functional Status Assessment Patient has had a recent decline in their functional status and demonstrates the ability to make significant improvements in function in a reasonable and predictable amount of time.     Precautions / Restrictions Precautions Precautions: Knee;Fall Precaution Booklet Issued: Yes (comment) Restrictions Weight Bearing Restrictions: Yes LLE Weight Bearing: Weight bearing as tolerated      Mobility  Bed Mobility Overal  bed mobility: Needs Assistance Bed Mobility: Supine to Sit, Sit to Supine     Supine to sit: Min assist, HOB elevated Sit to supine: Min assist, HOB elevated   General bed mobility comments: assist for L LE; vc's for technique    Transfers                   General transfer comment: Deferred transfers d/t B LE weakness and sensation impairments (anticipate d/t spinal anesthesia still wearing off)    Ambulation/Gait                  Stairs            Wheelchair Mobility     Tilt Bed    Modified Rankin (Stroke Patients Only)       Balance Overall balance assessment: Needs assistance Sitting-balance support: No upper extremity supported, Feet supported Sitting balance-Leahy Scale: Good Sitting balance - Comments: steady reaching within BOS                                     Pertinent Vitals/Pain Pain Assessment Pain Assessment: 0-10 Pain Score: 5  Pain Location: L knee Pain Descriptors / Indicators: Sore, Operative site guarding Pain Intervention(s): Limited activity within patient's tolerance, Monitored during session, Repositioned, Premedicated before session, Ice applied, Other (comment) (polar care applied) Vitals (HR and SpO2 on room air) stable and WFL throughout treatment session.    Home Living Family/patient expects to be discharged to:: Private residence Living Arrangements: Alone Available Help at Discharge: Family;Available PRN/intermittently Type of Home: Other(Comment) Counselling psychologist) Home Access:  Stairs to enter Entrance Stairs-Rails: None Entrance Stairs-Number of Steps: 4   Home Layout: One level Home Equipment: Agricultural consultant (2 wheels);Wheelchair - manual;Cane - single point      Prior Function Prior Level of Function : Independent/Modified Independent             Mobility Comments: Uses SPC in camper; uses RW vs manual w/c outside of home.  No recent falls reported. ADLs Comments: Takes showers at his  mom's 2-3 x's/week (no shower capabilities at his camper)     Extremity/Trunk Assessment   Upper Extremity Assessment Upper Extremity Assessment: Overall WFL for tasks assessed    Lower Extremity Assessment Lower Extremity Assessment: LLE deficits/detail (R LE grossly 3/5 AROM and decreased sensation R LE (anticipate strength/sensation limited d/t spinal still wearing off)) LLE Deficits / Details: hip flexion 3/5; knee flexion/extension at least 2+/5 (limited d/t knee pain); and DF 3/5 (anticipate strength/sensation limited d/t spinal still wearing off) LLE Sensation: decreased light touch    Cervical / Trunk Assessment Cervical / Trunk Assessment: Normal  Communication   Communication Communication: No apparent difficulties Cueing Techniques: Verbal cues;Visual cues  Cognition Arousal: Alert Behavior During Therapy: WFL for tasks assessed/performed Overall Cognitive Status: Within Functional Limits for tasks assessed                                          General Comments General comments (skin integrity, edema, etc.): L LE dressing/bandages in place.  Nursing cleared pt for participation in physical therapy.  Pt agreeable to PT session.  Pt's sister present towards end of session.    Exercises Total Joint Exercises Ankle Circles/Pumps: AROM, Strengthening, Both, 10 reps, Supine Quad Sets: AROM, Strengthening, 10 reps, Supine Heel Slides: AAROM, Strengthening, Left, 10 reps, Supine Hip ABduction/ADduction: AAROM, Strengthening, Left, 10 reps, Supine Straight Leg Raises: AAROM, Strengthening, Left, 10 reps, Supine Goniometric ROM: L knee AROM approximately 5 degrees short of neutral extension to approximately 60 degrees flexion   Assessment/Plan    PT Assessment Patient needs continued PT services  PT Problem List Decreased strength;Decreased range of motion;Decreased activity tolerance;Decreased balance;Decreased mobility;Decreased knowledge of use of  DME;Decreased knowledge of precautions;Impaired sensation;Pain;Decreased skin integrity       PT Treatment Interventions DME instruction;Gait training;Stair training;Functional mobility training;Therapeutic activities;Therapeutic exercise;Balance training;Patient/family education    PT Goals (Current goals can be found in the Care Plan section)  Acute Rehab PT Goals Patient Stated Goal: to improve pain and walking PT Goal Formulation: With patient Time For Goal Achievement: 08/22/23 Potential to Achieve Goals: Good    Frequency BID     Co-evaluation               AM-PAC PT "6 Clicks" Mobility  Outcome Measure Help needed turning from your back to your side while in a flat bed without using bedrails?: None Help needed moving from lying on your back to sitting on the side of a flat bed without using bedrails?: A Little Help needed moving to and from a bed to a chair (including a wheelchair)?: A Little Help needed standing up from a chair using your arms (e.g., wheelchair or bedside chair)?: A Lot Help needed to walk in hospital room?: Total Help needed climbing 3-5 steps with a railing? : Total 6 Click Score: 14    End of Session Equipment Utilized During Treatment: Gait belt Activity Tolerance: Patient tolerated treatment  well Patient left: in bed;with call bell/phone within reach;with bed alarm set;with family/visitor present;with SCD's reapplied;Other (comment) (B heels floating via towel rolls) Nurse Communication: Mobility status;Precautions;Weight bearing status PT Visit Diagnosis: Other abnormalities of gait and mobility (R26.89);Muscle weakness (generalized) (M62.81);Pain Pain - Right/Left: Left Pain - part of body: Knee    Time: 1610-9604 PT Time Calculation (min) (ACUTE ONLY): 23 min   Charges:   PT Evaluation $PT Eval Low Complexity: 1 Low PT Treatments $Therapeutic Exercise: 8-22 mins PT General Charges $$ ACUTE PT VISIT: 1 Visit        Hendricks Limes, PT 08/08/23, 5:44 PM

## 2023-08-08 NOTE — Anesthesia Procedure Notes (Signed)
Procedure Name: MAC Date/Time: 08/08/2023 10:30 AM  Performed by: Lily Lovings, CRNAPre-anesthesia Checklist: Patient identified, Suction available, Emergency Drugs available, Patient being monitored and Timeout performed Patient Re-evaluated:Patient Re-evaluated prior to induction Oxygen Delivery Method: Simple face mask Preoxygenation: Pre-oxygenation with 100% oxygen Induction Type: IV induction

## 2023-08-08 NOTE — Transfer of Care (Signed)
Immediate Anesthesia Transfer of Care Note  Patient: Clarence Cook  Procedure(s) Performed: TOTAL KNEE ARTHROPLASTY (Left: Knee)  Patient Location: PACU  Anesthesia Type:MAC and Spinal  Level of Consciousness: drowsy and patient cooperative  Airway & Oxygen Therapy: Patient Spontanous Breathing and Patient connected to face mask oxygen  Post-op Assessment: Report given to RN and Post -op Vital signs reviewed and stable  Post vital signs: Reviewed and stable  Last Vitals:  Vitals Value Taken Time  BP 104/68 08/08/23 1311  Temp    Pulse 72 08/08/23 1312  Resp 19 08/08/23 1312  SpO2 99 % 08/08/23 1312  Vitals shown include unfiled device data.  Last Pain:  Vitals:   08/08/23 0849  TempSrc: Tympanic  PainSc: 7          Complications: No notable events documented.

## 2023-08-08 NOTE — H&P (Signed)
History of Present Illness: The patient is an 57 y.o. male seen in clinic today for history and physical for left total knee arthroplasty with Dr. Audelia Acton on 08/08/2023. Patient has advanced left knee degenerative arthritis, posttraumatic. His pain is severe and debilitating and has been increasing over the last 10 years. The patient reports the pain is over the anterior medial aspect of both knees with grinding and catching sensations. He does have a history of traumatic injury in the late 80s to his left knee and underwent a tibial rod which then later removed in the early 90s. Patient describes the pain as a sharp pressure which is worse with walking and going up and down stairs. He has been treated with injections including steroids the last shot was about 6 months ago with minimal improvement in pain and function. He has taken anti-inflammatories and gabapentin with minimal improvement. He reports the pain is up to a 9 out of 10 on a daily basis and limits his activities on a daily basis. 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 The patient denies fevers, chills, shortness of breath, chest pain, recent illness, or any trauma.  Of note the patient had previously been incarcerated for about 8 years and was recently released in a recovery program. 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 He recently completed a 30-day rehabilitation program and has been completely clean of alcohol and is currently seen by a psychiatrist Dr. Colvin Caroli.  Patient is a diabetic well-controlled with an A1c of 6.8, he has a history of smoking but has not been using smoking and is only been dipping a little bit and reports that he will stop today.   Past Medical History: Past Medical History:  Diagnosis Date  Brain injury (CMS/HHS-HCC)  Diabetes mellitus without complication  (CMS/HHS-HCC)  Hypertension   Past Surgical History: Past Surgical History:  Procedure Laterality Date  BACK SURGERY  FRACTURE SURGERY Left  left leg  neck surgery  wrist surgery Right   Past Family History: Family History  Problem Relation Age of Onset  High blood pressure (Hypertension) Mother  Diabetes type II Mother  Heart disease Father  Diabetes type II Father   Medications: Current Outpatient Medications  Medication Sig Dispense Refill  ARIPiprazole (ABILIFY) 10 MG tablet Take 10 mg by mouth once daily  gabapentin (NEURONTIN) 300 MG capsule 300 mg 3 (three) times daily  glipiZIDE (GLUCOTROL) 5 MG tablet Take 5 mg by mouth every morning  lisinopriL (ZESTRIL) 10 MG tablet Take 1 tablet by mouth once daily  meloxicam (MOBIC) 15 MG tablet Take 15 mg by mouth once daily  metFORMIN (GLUCOPHAGE-XR) 500 MG XR tablet Take 1,500 mg by mouth once daily  naltrexone (REVIA) 50 mg tablet Take 50 mg by mouth once daily  topiramate (TOPAMAX) 25 MG tablet Take 25 mg by mouth 2 (two) times daily  traZODone (DESYREL) 100 MG tablet Take 100 mg by mouth at bedtime   No current facility-administered medications for this visit.   Allergies: No Known Allergies   Visit Vitals: Vitals:  07/26/23 0918  BP: 124/80    Review of Systems:  A comprehensive 14 point ROS was performed, reviewed, and the pertinent orthopaedic findings are documented in the HPI.  Physical Exam: General:  Well developed, well nourished, no apparent distress, antalgic gait with a cane  HEENT: Head  normocephalic, atraumatic, PERRL.   Abdomen: Soft, non tender, non distended, Bowel sounds present.  Heart: Examination of the heart reveals regular, rate, and rhythm. There is no murmur noted on ascultation. There is a normal apical pulse.  Lungs: Lungs are clear to auscultation. There is no wheeze, rhonchi, or crackles. There is normal expansion of bilateral chest walls.   Comprehensive Knee Exam: Gait  Antalgic with varus thrust bilaterally  Alignment Neutral   Inspection Right Left  Skin Normal appearance with no obvious deformity. No ecchymosis or erythema. Normal appearance with no obvious deformity. Healed midline incision the distal aspect of his knee. no ecchymosis or erythema.  Soft Tissue No focal soft tissue swelling No focal soft tissue swelling  Quad Atrophy None None   Palpation  Right Left  Tenderness Medial joint line tenderness to palpation Needle joint line and parapatellar tenderness to palpation  Crepitus + patellofemoral and tibiofemoral crepitus + patellofemoral and tibiofemoral crepitus  Effusion None None   Range of Motion Right Left  Flexion 0-115 5-110  Extension Full knee extension without hyperextension Full knee extension without hyperextension   Ligamentous Exam Right Left  Lachman Normal 2+ sloppy endpoint  Valgus 0 Normal Normal  Valgus 30 Normal Normal  Varus 0 Normal Normal  Varus 30 Normal Normal  Anterior Drawer Normal 2+ sloppy endpoint  Posterior Drawer Normal Normal   Meniscal Exam Right Left  Hyperflexion Test Positive Positive  Hyperextension Test Positive Positive  McMurray's Positive Positive   Neurovascular Right Left  Quadriceps Strength 5/5 5/5  Hamstring Strength 5/5 5/5  Hip Abductor Strength 4/5 4/5  Distal Motor Normal Normal  Distal Sensory Normal light touch sensation Normal light touch sensation  Distal Pulses Normal Normal    Imaging Studies: I have reviewed AP, lateral,sunrise, and flexed PA weight bearing knee X-rays (5 views) of the left and right knee that show severe degenerative changes in both knees with medial joint space narrowing and bone-on-bone articulation of the medial compartments of both knees as well as patellofemoral bone-on-bone articulation with osteophyte formation, subchondral cysts and sclerosis. 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 No fractures dislocations noted  the left knee does have large ossified loose bodies adjacent to the superior pole the patella.  Assessment:  Advanced left knee osteoarthritis  Plan: Clarence Cook is a 57 year old male with advanced left knee osteoarthritis with complete loss of joint space. Patient has had traumatic injury to the left knee with surgery dating back to the 80s/90s. He has had progressive increasing pain with limited mobility. His pain interferes the quality of life and activities day living. He said no relief with conservative treatment. He has seen Dr. Audelia Acton, discussed total knee arthroplasty and agreed and consented to a left total knee arthroplasty. Risks, benefits, complications of a left total knee arthroplasty have been discussed with the patient. Patient has agreed and consented the procedure with Dr. Audelia Acton on 08/08/2023. Patient currently lives with his mother who is 46 years old, she has a hard time caring for herself, patient may need rehab after hospital stay for his total knee arthroplasty.  The hospitalization and post-operative care and rehabilitation were also discussed. The use of perioperative antibiotics and DVT prophylaxis were discussed. The risk, benefits and alternatives to a surgical intervention were discussed at length with the patient. The patient was also advised of risks related to the medical comorbidities and elevated body mass index (BMI). A lengthy discussion took place to review the most common complications  including but not limited to: stiffness, loss of function, complex regional pain syndrome, deep vein thrombosis, pulmonary embolus, heart attack, stroke, infection, wound breakdown, numbness, intraoperative fracture, damage to nerves, tendon,muscles, arteries or other blood vessels, death and other possible complications from anesthesia. The patient was told that we will take steps to minimize these risks by using sterile technique, antibiotics and DVT prophylaxis when appropriate and follow  the patient postoperatively in the office setting to monitor progress. The possibility of recurrent pain, no improvement in pain and actual worsening of pain were also discussed with the patient.   All questions answered patient agrees with above plan to proceed with left total knee arthroplasty. He has maintained tobacco cessation. He obtained appropriate clearances for surgery.

## 2023-08-08 NOTE — Interval H&P Note (Signed)
Patient history and physical updated. Consent reviewed including risks, benefits, and alternatives to surgery. Patient agrees with above plan to proceed with left total knee arthroplasty.

## 2023-08-09 ENCOUNTER — Encounter: Payer: Self-pay | Admitting: Orthopedic Surgery

## 2023-08-09 DIAGNOSIS — M1712 Unilateral primary osteoarthritis, left knee: Secondary | ICD-10-CM | POA: Diagnosis not present

## 2023-08-09 LAB — BASIC METABOLIC PANEL
Anion gap: 8 (ref 5–15)
BUN: 18 mg/dL (ref 6–20)
CO2: 22 mmol/L (ref 22–32)
Calcium: 8.6 mg/dL — ABNORMAL LOW (ref 8.9–10.3)
Chloride: 103 mmol/L (ref 98–111)
Creatinine, Ser: 0.66 mg/dL (ref 0.61–1.24)
GFR, Estimated: >60 mL/min (ref >60)
Glucose, Bld: 222 mg/dL — ABNORMAL HIGH (ref 70–99)
Potassium: 3.9 mmol/L (ref 3.5–5.1)
Sodium: 133 mmol/L — ABNORMAL LOW (ref 135–145)

## 2023-08-09 LAB — CBC
HCT: 39.8 % (ref 39.0–52.0)
Hemoglobin: 13.7 g/dL (ref 13.0–17.0)
MCH: 30.2 pg (ref 26.0–34.0)
MCHC: 34.4 g/dL (ref 30.0–36.0)
MCV: 87.9 fL (ref 80.0–100.0)
Platelets: 159 10*3/uL (ref 150–400)
RBC: 4.53 MIL/uL (ref 4.22–5.81)
RDW: 12.9 % (ref 11.5–15.5)
WBC: 18 10*3/uL — ABNORMAL HIGH (ref 4.0–10.5)
nRBC: 0 % (ref 0.0–0.2)

## 2023-08-09 LAB — GLUCOSE, CAPILLARY
Glucose-Capillary: 182 mg/dL — ABNORMAL HIGH (ref 70–99)
Glucose-Capillary: 248 mg/dL — ABNORMAL HIGH (ref 70–99)

## 2023-08-09 MED ORDER — ONDANSETRON HCL 4 MG PO TABS: 4.0000 mg | ORAL_TABLET | Freq: Four times a day (QID) | ORAL | 0 refills | Status: DC | PRN

## 2023-08-09 MED ORDER — KETOROLAC TROMETHAMINE 15 MG/ML IJ SOLN
INTRAMUSCULAR | Status: AC
Start: 2023-08-09 — End: ?
  Filled 2023-08-09: qty 1

## 2023-08-09 MED ORDER — HYDROCODONE-ACETAMINOPHEN 5-325 MG PO TABS
ORAL_TABLET | ORAL | Status: AC
  Filled 2023-08-09: qty 2

## 2023-08-09 MED ORDER — CELECOXIB 200 MG PO CAPS: 200.0000 mg | ORAL_CAPSULE | Freq: Two times a day (BID) | ORAL | 0 refills | Status: AC

## 2023-08-09 MED ORDER — CEPHALEXIN 500 MG PO CAPS
ORAL_CAPSULE | ORAL | Status: AC
  Filled 2023-08-09: qty 1

## 2023-08-09 MED ORDER — GABAPENTIN 300 MG PO CAPS
ORAL_CAPSULE | ORAL | Status: AC
  Filled 2023-08-09: qty 1

## 2023-08-09 MED ORDER — ACETAMINOPHEN 500 MG PO TABS
ORAL_TABLET | ORAL | Status: AC
  Filled 2023-08-09: qty 2

## 2023-08-09 MED ORDER — INSULIN ASPART 100 UNIT/ML IJ SOLN
INTRAMUSCULAR | Status: AC
Start: 2023-08-09 — End: ?
  Filled 2023-08-09: qty 1

## 2023-08-09 MED ORDER — CEPHALEXIN 500 MG PO CAPS: 500.0000 mg | ORAL_CAPSULE | Freq: Four times a day (QID) | ORAL | 0 refills | Status: AC

## 2023-08-09 MED ORDER — KETOROLAC TROMETHAMINE 15 MG/ML IJ SOLN
INTRAMUSCULAR | Status: AC
  Filled 2023-08-09: qty 1

## 2023-08-09 MED ORDER — LISINOPRIL 20 MG PO TABS
ORAL_TABLET | ORAL | Status: AC
  Filled 2023-08-09: qty 1

## 2023-08-09 MED ORDER — INSULIN ASPART 100 UNIT/ML IJ SOLN
INTRAMUSCULAR | Status: AC
  Filled 2023-08-09: qty 1

## 2023-08-09 MED ORDER — CEPHALEXIN 500 MG PO CAPS
500.0000 mg | ORAL_CAPSULE | Freq: Four times a day (QID) | ORAL | Status: DC
  Administered 2023-08-09 (×2): 500 mg via ORAL

## 2023-08-09 MED ORDER — PANTOPRAZOLE SODIUM 40 MG PO TBEC
DELAYED_RELEASE_TABLET | ORAL | Status: AC
  Filled 2023-08-09: qty 1

## 2023-08-09 MED ORDER — DOCUSATE SODIUM 100 MG PO CAPS: 100.0000 mg | ORAL_CAPSULE | Freq: Two times a day (BID) | ORAL | 0 refills | Status: DC

## 2023-08-09 MED ORDER — DOCUSATE SODIUM 100 MG PO CAPS
ORAL_CAPSULE | ORAL | Status: AC
  Filled 2023-08-09: qty 1

## 2023-08-09 MED ORDER — HYDROCODONE-ACETAMINOPHEN 5-325 MG PO TABS
ORAL_TABLET | ORAL | Status: AC
  Filled 2023-08-09: qty 1

## 2023-08-09 MED ORDER — HYDROCODONE-ACETAMINOPHEN 5-325 MG PO TABS: 1.0000 | ORAL_TABLET | Freq: Four times a day (QID) | ORAL | 0 refills | Status: DC | PRN

## 2023-08-09 MED ORDER — ENOXAPARIN SODIUM 30 MG/0.3ML IJ SOSY
PREFILLED_SYRINGE | INTRAMUSCULAR | Status: AC
  Filled 2023-08-09: qty 0.3

## 2023-08-09 MED ORDER — ACETAMINOPHEN 500 MG PO TABS
ORAL_TABLET | ORAL | Status: AC
Start: 2023-08-09 — End: ?
  Filled 2023-08-09: qty 2

## 2023-08-09 MED ORDER — ENOXAPARIN SODIUM 40 MG/0.4ML IJ SOSY: 40.0000 mg | PREFILLED_SYRINGE | INTRAMUSCULAR | 0 refills | Status: DC

## 2023-08-09 MED ORDER — TRAMADOL HCL 50 MG PO TABS: 50.0000 mg | ORAL_TABLET | Freq: Four times a day (QID) | ORAL | 0 refills | Status: DC | PRN

## 2023-08-09 NOTE — TOC Initial Note (Addendum)
Transition of Care [facility]) - Initial/Assessment Note    Patient Details  Name: Clarence Cook MRN: [id number] Date of Birth: January 12, 1966  Transition of Care [facility]) CM/SW Contact:    Marlowe Sax, RN Phone Number: 08/09/2023, 9:37 AM  Clinical Narrative:                   Expected Discharge Plan: Home w Home Health Services    Met with the patient and his sister in the room to discuss DC plan He lives in a [place] and has a generator, he uses Bottled water  [place] is set up for [facility] prior to surgery by Surgeons office I explained to the patient that they will call him after he goes home to set up when they will come to see him for [facility] PT and OT I explained that if he chooses to stay with his mom then he will need to provide [facility] with the address where he will be He will get a RW and 3 in 1 delivered by [employer] to the bedside to take home I explained that he can call his insurance company and they will set up medical transportation to go to his medical appointments, he asked me if they would take him to his moms to get the [facility] PT there, I explained that no they will only pick up from a residence and take to a doctors office or outpatient pt facility for medical appointments He stated understanding He asked me how long HH will be I let him know that typically it is for 2 weeks then transition to outpatient, I also explained that he will need to set up with his insurance for transportation in advance, he stated agreement  Patient Goals and CMS Choice            Expected Discharge Plan and Services   Discharge Planning Services: CM Consult   Living arrangements for the past 2 months:  (camper)                 DME Arranged: Dan Humphreys rolling, 3-N-1 DME Agency: [employer] Date DME Agency Contacted: 08/09/23 Time DME Agency Contacted: [phone] Representative spoke with at DME Agency: Osvaldo Angst Arranged: PT, OT [facility] Agency: [facility] Date [facility] Agency Contacted: 08/09/23 Time HH  Agency Contacted: [phone] Representative spoke with at [facility] Agency: Kandee Keen  Prior Living Arrangements/Services Living arrangements for the past 2 months:  (camper) Lives with:: Self                   Activities of Daily Living   ADL Screening (condition at time of admission) Independently performs ADLs?: Yes (appropriate for developmental age) Is the patient deaf or have difficulty hearing?: Yes (hearing aid left ear only) Does the patient have difficulty seeing, even when wearing glasses/contacts?: Yes (reading) Does the patient have difficulty concentrating, remembering, or making decisions?: No  Permission Sought/Granted                  Emotional Assessment              Admission diagnosis:  S/P TKR (total knee replacement), left [Z96.652] Patient Active Problem List   Diagnosis Date Noted   S/P TKR (total knee replacement), left 08/08/2023   PCP:  Evelene Croon, MD Pharmacy:   [facility] [phone] Nicholes Rough, [facility] - [address] [place] [address] Phone: [phone] Fax: [phone]     Social Determinants of Health (SDOH) Social History:  SDOH Screenings   Food Insecurity: Food Insecurity Present (08/08/2023)  Housing: Medium Risk (08/08/2023)  Transportation Needs: No Transportation Needs (08/08/2023)  Utilities: Not At Risk (08/08/2023)  Tobacco Use: Medium Risk (08/08/2023)   SDOH Interventions:     Readmission Risk Interventions     No data to display

## 2023-08-09 NOTE — Progress Notes (Signed)
   Subjective: 1 Day Post-Op Procedure(s) (LRB): TOTAL KNEE ARTHROPLASTY (Left) Patient reports pain as 0 on 0-10 scale.   Patient is well, and has had no acute complaints or problems Denies any CP, SOB, ABD pain. We will continue therapy today.  Plan is to go Home after hospital stay.  Objective: Vital signs in last 24 hours: Temp:  [97.1 F (36.2 C)-97.8 F (36.6 C)] 97.8 F (36.6 C) (10/22 0440) Pulse Rate:  [54-89] 89 (10/22 0440) Resp:  [14-21] 16 (10/22 0440) BP: (94-145)/(64-92) 124/79 (10/22 0440) SpO2:  [96 %-100 %] 98 % (10/22 0440) Weight:  [77.6 kg] 77.6 kg (10/21 0849)  Intake/Output from previous day: 10/21 0701 - 10/22 0700 In: 2507.5 [P.O.:720; I.V.:1187.5; IV Piggyback:600] Out: 4230 [Urine:4205; Blood:25] Intake/Output this shift: No intake/output data recorded.  Recent Labs    08/09/23 0601  HGB 13.7   Recent Labs    08/09/23 0601  WBC 18.0*  RBC 4.53  HCT 39.8  PLT 159   Recent Labs    08/09/23 0601  NA 133*  K 3.9  CL 103  CO2 22  BUN 18  CREATININE 0.66  GLUCOSE 222*  CALCIUM 8.6*   No results for input(s): "LABPT", "INR" in the last 72 hours.  EXAM General - Patient is Alert, Appropriate, and Oriented Extremity - Neurovascular intact Sensation intact distally Intact pulses distally Dorsiflexion/Plantar flexion intact No cellulitis present Compartment soft Dressing - dressing C/D/I and no drainage Motor Function - intact, moving foot and toes well on exam.   Past Medical History:  Diagnosis Date   Brain injury (HCC) 1989   Diabetes (HCC)    Hypertension     Assessment/Plan:   1 Day Post-Op Procedure(s) (LRB): TOTAL KNEE ARTHROPLASTY (Left) Principal Problem:   S/P TKR (total knee replacement), left  Estimated body mass index is 25.27 kg/m as calculated from the following:   Height as of this encounter: 5\' 9"  (1.753 m).   Weight as of this encounter: 77.6 kg. Advance diet Up with therapy Pain well  controlled Labs and VSS CM to assist with discharge   DVT Prophylaxis - Lovenox, TED hose, and SCDs Weight-Bearing as tolerated to left leg   T. Cranston Neighbor, PA-C [facility] 08/09/2023, 7:58 AM

## 2023-08-09 NOTE — Plan of Care (Signed)
  Problem: Fluid Volume: Goal: Ability to maintain a balanced intake and output will improve Outcome: Progressing   Problem: Nutritional: Goal: Maintenance of adequate nutrition will improve Outcome: Progressing   Problem: Activity: Goal: Ability to avoid complications of mobility impairment will improve Outcome: Progressing   Problem: Pain Management: Goal: Pain level will decrease with appropriate interventions Outcome: Progressing

## 2023-08-09 NOTE — Discharge Instructions (Signed)

## 2023-08-09 NOTE — Progress Notes (Addendum)
Patient is not able to walk the distance required to go the bathroom, or he/she is unable to safely negotiate stairs required to access the bathroom.  A 3in1 BSC will alleviate this problem  

## 2023-08-09 NOTE — Plan of Care (Signed)
  Problem: Activity: Goal: Ability to avoid complications of mobility impairment will improve Outcome: Progressing   Problem: Pain Management: Goal: Pain level will decrease with appropriate interventions Outcome: Progressing   

## 2023-08-09 NOTE — Progress Notes (Signed)
Physical Therapy Treatment Patient Details Name: Clarence Cook MRN: [id number] DOB: 27-Sep-1966 Today's Date: 08/09/2023   History of Present Illness Pt is a 57 y.o. male s/p L TKA secondary to OA 08/08/23.  PMH includes COPD, CPAP, htn, DM, brain injury, back sx, neck sx, R wrist sx, L fx sx.    PT Comments  Pt alert and oriented and receptive to therapy session. Pt reporting 2/10 L knee pain at rest, with increase to 5/10 while performing home exercise program. TOC stopped in during session to discuss d/c location, DME, and rehab plan after d/c. Pt mod I with bed mobility, sitting balance, and STS transfer with RW. Pt amb 80' over even surface with CGA and RW (pt defaults to step-through pattern with antalgic pattern on the L, causing increased forward flexion, but able to maintain step-to gait pattern with constant VCs). Pt amb 40' with CGA and VCs to pick up and place RW on the ground to mimic amb over uneven surfaces. Pt stable with all amb and no LOB noted t/o session. Pt ascended/descended 4 steps x 3 and verbalized understanding of orthopedic gait pattern. 2 trials using SPC in R hand and L railing simulating home setup with CGA for safety, pt stable and no LOB noted. Per pt's sister request, 1 trial using RW with CGA for safety and VCs to stabilize walker before each step (ascending backwards, descending forwards). Pt and sister educated on all mobility precautions and verbalized understanding. Pt reporting 8/10 L knee pain after session and RN left in room to administer pain meds. Pt would benefit from continued therapy after d/c to increase functional independence and return to PLOF.   If plan is discharge home, recommend the following: A little help with walking and/or transfers;A little help with bathing/dressing/bathroom;Assistance with cooking/housework;Assist for transportation;Help with stairs or ramp for entrance   Can travel by private vehicle      yes  Equipment Recommendations   Rolling walker (2 wheels);BSC/3in1    Recommendations for Other Services       Precautions / Restrictions Precautions Precautions: Knee;Fall Precaution Booklet Issued: Yes (comment) Restrictions Weight Bearing Restrictions: Yes LLE Weight Bearing: Weight bearing as tolerated     Mobility  Bed Mobility Overal bed mobility: Modified Independent Bed Mobility: Supine to Sit, Sit to Supine     Supine to sit: Modified independent (Device/Increase time) Sit to supine: Modified independent (Device/Increase time)   General bed mobility comments: increased time but performs safe bed transfers    Transfers Overall transfer level: Modified independent Equipment used: Rolling walker (2 wheels)               General transfer comment: mod I with STS and RW from low surface; pt educated on extending LLE during transfer for comfort    Ambulation/Gait Ambulation/Gait assistance: Contact guard assist   Assistive device: Rolling walker (2 wheels) Gait Pattern/deviations: Step-through pattern, Decreased step length - right, Decreased stance time - left, Antalgic, Trunk flexed Gait velocity: decreased     General Gait Details: 66' over even surface with CGA and RW. Pt defaults to step-through with antalgic pattern on the L, causing increased forward flexion, but able to maintain step-to gait pattern with constant VCs. To emulate amb over uneven surfaces 2/2 gravel and tree roots at pt's home, pt amb 85' with CGA and VCs to pick up and place RW on the ground vs rolling continuously. Pt stable with amb over even and simulated uneven ground, no LOB noted t/o  session.   Stairs Stairs: Yes Stairs assistance: Contact guard assist Stair Management:  (to mimic home setup, pt used L hand to grab railing as if he was holding onto the inside of his door, cane in R hand) Number of Stairs: 4 (x 3) General stair comments: Pt verbalized understanding of orthopedic gait pattern with stair  negotiation. 2 trials using SPC in R hand and L railing per home setup with CGA for safety, pt stable and no LOB noted. Per pt's sister request, 1 trial using RW with CGA for safety and VCs to stabilize walker before each step (ascending backwards, descending forwards). Pt stable with RW as well.   Wheelchair Mobility     Tilt Bed    Modified Rankin (Stroke Patients Only)       Balance Overall balance assessment: Needs assistance Sitting-balance support: Feet supported, No upper extremity supported Sitting balance-Leahy Scale: Good Sitting balance - Comments: mod I with sitting balance, steady when reaching outside BOS   Standing balance support: Bilateral upper extremity supported, Reliant on assistive device for balance Standing balance-Leahy Scale: Fair Standing balance comment: limited 2/2 pain, but steady with dynamic balance                            Cognition Arousal: Alert Behavior During Therapy: WFL for tasks assessed/performed Overall Cognitive Status: Within Functional Limits for tasks assessed                                          Exercises Total Joint Exercises Ankle Circles/Pumps: AROM, Strengthening, Both, 10 reps, Supine Quad Sets: AROM, Strengthening, 10 reps, Supine, Left Heel Slides: Strengthening, Left, 10 reps, Supine, AROM Hip ABduction/ADduction: AAROM, Strengthening, Left, 10 reps, Supine Straight Leg Raises: Strengthening, Left, 10 reps, Supine, AROM Long Arc Quad: AROM, Strengthening, Left, 10 reps, Seated Knee Flexion: Seated, 10 reps, AROM (stretching) Goniometric ROM: L knee AROM -5 degrees short on neutral extension; 95 degrees flexion    General Comments        Pertinent Vitals/Pain Pain Assessment Pain Assessment: 0-10 Pain Score: 5  Pain Location: L knee: 2/10 at rest, 5/10 with therex, 8/10 at end of session Pain Descriptors / Indicators: Sore, Operative site guarding Pain Intervention(s):  Repositioned, Ice applied, Patient requesting pain meds-RN notified, Monitored during session, Limited activity within patient's tolerance (Nursing administering pain meds after session)    Home Living                          Prior Function            PT Goals (current goals can now be found in the care plan section) Acute Rehab PT Goals Patient Stated Goal: to improve pain and walking PT Goal Formulation: With patient/family Time For Goal Achievement: 08/22/23 Potential to Achieve Goals: Good Progress towards PT goals: Progressing toward goals    Frequency    BID      PT Plan      Co-evaluation              AM-PAC PT "6 Clicks" Mobility   Outcome Measure  Help needed turning from your back to your side while in a flat bed without using bedrails?: None Help needed moving from lying on your back to sitting on the side of a flat  bed without using bedrails?: None Help needed moving to and from a bed to a chair (including a wheelchair)?: A Little Help needed standing up from a chair using your arms (e.g., wheelchair or bedside chair)?: None Help needed to walk in hospital room?: A Little Help needed climbing 3-5 steps with a railing? : A Little 6 Click Score: 21    End of Session Equipment Utilized During Treatment: Gait belt Activity Tolerance: Patient tolerated treatment well Patient left: in bed;with call bell/phone within reach;with bed alarm set;with family/visitor present;with SCD's reapplied;with nursing/sitter in room (polar care reapplied to L knee and bolster under L ankle for knee extension) Nurse Communication: Mobility status;Patient requests pain meds (safety with d/c from acute PT) PT Visit Diagnosis: Other abnormalities of gait and mobility (R26.89);Muscle weakness (generalized) (M62.81);Pain Pain - Right/Left: Left Pain - part of body: Knee     Time: 5409-8119 PT Time Calculation (min) (ACUTE ONLY): 62 min  Charges:    $Gait  Training: 23-37 mins $Therapeutic Exercise: 8-22 mins $Therapeutic Activity: 8-22 mins PT General Charges $$ ACUTE PT VISIT: 1 Visit                       Shauna Hugh, SPT 08/09/2023, 11:19 AM

## 2023-08-09 NOTE — Anesthesia Postprocedure Evaluation (Signed)
Anesthesia Post Note  Patient: Clarence Cook  Procedure(s) Performed: TOTAL KNEE ARTHROPLASTY (Left: Knee)  Patient location during evaluation: Nursing Unit Anesthesia Type: Spinal Level of consciousness: oriented and awake and alert Pain management: pain level controlled Vital Signs Assessment: post-procedure vital signs reviewed and stable Respiratory status: spontaneous breathing and respiratory function stable Cardiovascular status: blood pressure returned to baseline and stable Postop Assessment: no headache, no backache, no apparent nausea or vomiting and patient able to bend at knees Anesthetic complications: no   No notable events documented.   Last Vitals:  Vitals:   08/08/23 2134 08/09/23 0440  BP: (!) 133/92 124/79  Pulse: 86 89  Resp: 18 16  Temp: (!) 36.4 C 36.6 C  SpO2: 98% 98%    Last Pain:  Vitals:   08/09/23 0520  TempSrc:   PainSc: 3                  Alexus Michael Lawerance Cruel

## 2023-08-09 NOTE — Discharge Summary (Signed)
Physician Discharge Summary  Patient ID: Clarence Cook MRN: [id number] DOB/AGE: [birth year] 57 y.o.  Admit date: 08/08/2023 Discharge date: 08/09/2023  Admission Diagnoses:  S/P TKR (total knee replacement), left [Z96.652]   Discharge Diagnoses: Patient Active Problem List   Diagnosis Date Noted   S/P TKR (total knee replacement), left 08/08/2023    Past Medical History:  Diagnosis Date   Brain injury (HCC) 1989   Diabetes (HCC)    Hypertension      Transfusion: nne   Consultants (if any):   Discharged Condition: Improved  Hospital Course: Clarence Cook is an 57 y.o. male who was admitted 08/08/2023 with a diagnosis of S/P TKR (total knee replacement), left and went to the operating room on 08/08/2023 and underwent the above named procedures.    Surgeries: Procedure(s): TOTAL KNEE ARTHROPLASTY on 08/08/2023 Patient tolerated the surgery well. Taken to PACU where she was stabilized and then transferred to the orthopedic floor.  Started on Lovenox 40 mg q 24 hrs. TEDs and SCDs applied bilaterally. Heels elevated on bed. No evidence of DVT. Negative Homan. Physical therapy started on day #1 for gait training and transfer. OT started day #1 for ADL and assisted devices.  Patient's IV was d/c on day #1. Patient was able to safely and independently complete all PT goals. PT recommending discharge to home.    On post op day #1 patient was stable and ready for discharge to home with HHPT.  Implants:  Femur: Persona PPS #7 CR   Tibia: Size F OsseoTi pressfit  Poly: 10mm MC  Patella: 32x90mm osseoti pressfit 3 peg   He was given perioperative antibiotics:  Anti-infectives (From admission, onward)    Start     Dose/Rate Route Frequency Ordered Stop   08/09/23 0800  cephALEXin (KEFLEX) capsule 500 mg        500 mg Oral Every 6 hours 08/09/23 0758 08/19/23 0559   08/09/23 0000  cephALEXin (KEFLEX) 500 MG capsule        500 mg Oral Every 6 hours 08/09/23 0802 08/19/23 2359    08/08/23 1700  ceFAZolin (ANCEF) IVPB 2g/100 mL premix        2 g 200 mL/hr over 30 Minutes Intravenous Every 6 hours 08/08/23 1342 08/08/23 2343   08/08/23 0845  ceFAZolin (ANCEF) IVPB 2g/100 mL premix        2 g 200 mL/hr over 30 Minutes Intravenous On call to O.R. 08/08/23 0865 08/08/23 1037     .  He was given sequential compression devices, early ambulation, and lovenox TEDs for DVT prophylaxis.  He benefited maximally from the hospital stay and there were no complications.    Recent vital signs:  Vitals:   08/09/23 0440 08/09/23 0755  BP: 124/79 110/73  Pulse: 89 (!) 51  Resp: 16 17  Temp: 97.8 F (36.6 C) 97.9 F (36.6 C)  SpO2: 98%     Recent laboratory studies:  Lab Results  Component Value Date   HGB 13.7 08/09/2023   HGB 15.8 07/27/2023   HGB 13.2 04/01/2013   Lab Results  Component Value Date   WBC 18.0 (H) 08/09/2023   PLT 159 08/09/2023   Lab Results  Component Value Date   INR 0.99 03/29/2013   Lab Results  Component Value Date   NA 133 (L) 08/09/2023   K 3.9 08/09/2023   CL 103 08/09/2023   CO2 22 08/09/2023   BUN 18 08/09/2023   CREATININE 0.66 08/09/2023   GLUCOSE 222 (  H) 08/09/2023    Discharge Medications:   Allergies as of 08/09/2023       Reactions   Codeine    Unknown        Medication List     STOP taking these medications    meloxicam 15 MG tablet Commonly known as: MOBIC       TAKE these medications    aripiprazole 10 MG disintegrating tablet Commonly known as: ABILIFY Take 10 mg by mouth daily.   celecoxib 200 MG capsule Commonly known as: CeleBREX Take 1 capsule (200 mg total) by mouth 2 (two) times daily for 10 days.   cephALEXin 500 MG capsule Commonly known as: KEFLEX Take 1 capsule (500 mg total) by mouth every 6 (six) hours for 10 days.   docusate sodium 100 MG capsule Commonly known as: COLACE Take 1 capsule (100 mg total) by mouth 2 (two) times daily.   enoxaparin 40 MG/0.4ML  injection Commonly known as: LOVENOX Inject 0.4 mLs (40 mg total) into the skin daily for 14 days.   gabapentin 300 MG capsule Commonly known as: NEURONTIN Take 300 mg by mouth 3 (three) times daily.   glipiZIDE 5 MG 24 hr tablet Commonly known as: GLUCOTROL XL Take 5 mg by mouth daily with breakfast.   HYDROcodone-acetaminophen 5-325 MG tablet Commonly known as: NORCO/VICODIN Take 1-2 tablets by mouth every 6 (six) hours as needed for severe pain (pain score 7-10) (pain score 4-6).   lisinopril 10 MG tablet Commonly known as: ZESTRIL Take 10 mg by mouth daily.   metFORMIN 500 MG 24 hr tablet Commonly known as: GLUCOPHAGE-XR Take 1,500 mg by mouth daily with breakfast.   naltrexone 50 MG tablet Commonly known as: DEPADE Take 50 mg by mouth daily.   ondansetron 4 MG tablet Commonly known as: ZOFRAN Take 1 tablet (4 mg total) by mouth every 6 (six) hours as needed for nausea.   topiramate 25 MG tablet Commonly known as: TOPAMAX Take 25 mg by mouth 2 (two) times daily.   traMADol 50 MG tablet Commonly known as: ULTRAM Take 1 tablet (50 mg total) by mouth every 6 (six) hours as needed for moderate pain (pain score 4-6).   traZODone 100 MG tablet Commonly known as: DESYREL Take 200 mg by mouth at bedtime.               Durable Medical Equipment  (From admission, onward)           Start     Ordered   08/09/23 0846  For home use only DME Walker rolling  Once       Question Answer Comment  Walker: With 5 Inch Wheels   Patient needs a walker to treat with the following condition Impaired mobility      08/09/23 0845   08/09/23 0846  For home use only DME Bedside commode  Once       Question:  Patient needs a bedside commode to treat with the following condition  Answer:  Impaired mobility   08/09/23 0845            Diagnostic Studies: DG Knee Left Port  Result Date: 08/08/2023 CLINICAL DATA:  Postop knee replacement. EXAM: PORTABLE LEFT KNEE - 1-2  VIEW COMPARISON:  None Available. FINDINGS: Left knee arthroplasty in expected alignment. No periprosthetic lucency or fracture. Recent postsurgical change includes air and edema in the soft tissues and joint space. IMPRESSION: Left knee arthroplasty without immediate postoperative complication. Electronically Signed   By:  Narda Rutherford M.D.   On: 08/08/2023 15:12    Disposition: Discharge disposition: 01-Home or Self Care          Follow-up Information     Evon Slack, PA-C Follow up in 2 week(s).   Specialties: Orthopedic Surgery, Emergency Medicine Why: 08/23/2023 10:45 AM Post Op Contact information: [address] [place] [address] [phone]                  Signed: Patience Musca 08/09/2023, 12:16 PM

## 2023-08-09 NOTE — Progress Notes (Signed)
DISCHARGE NOTE:  Pt given discharge instructions and scripts, and verbalized understanding. TED hose on both legs. BSC and walker sent with pt. Pt wheeled to car, sister providing transportation.

## 2023-08-11 ENCOUNTER — Encounter: Payer: Self-pay | Admitting: Orthopedic Surgery

## 2023-08-13 LAB — AEROBIC/ANAEROBIC CULTURE W GRAM STAIN (SURGICAL/DEEP WOUND)
Culture: NO GROWTH
Culture: NO GROWTH
Gram Stain: NONE SEEN
Gram Stain: NONE SEEN

## 2023-08-13 IMAGING — DX DG HAND COMPLETE 3+V*R*
3 series · 3 of 3 positions shown · non-contrast
Comparison: None.

CLINICAL DATA: Laceration to fifth finger

EXAM:
RIGHT HAND - COMPLETE 3+ VIEW

[hand ap]
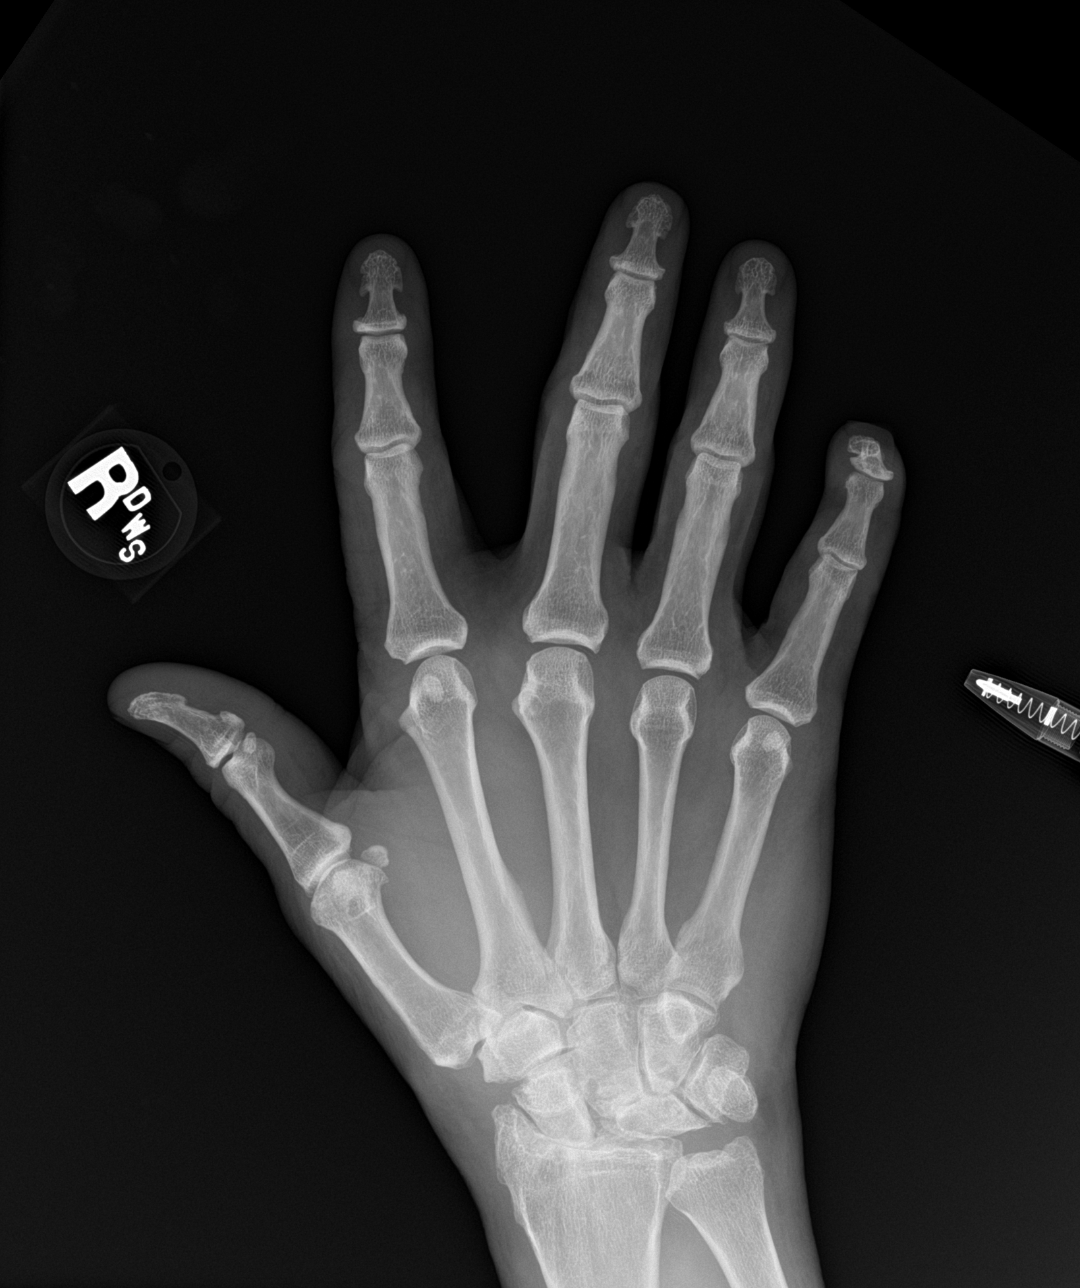

[hand obl]
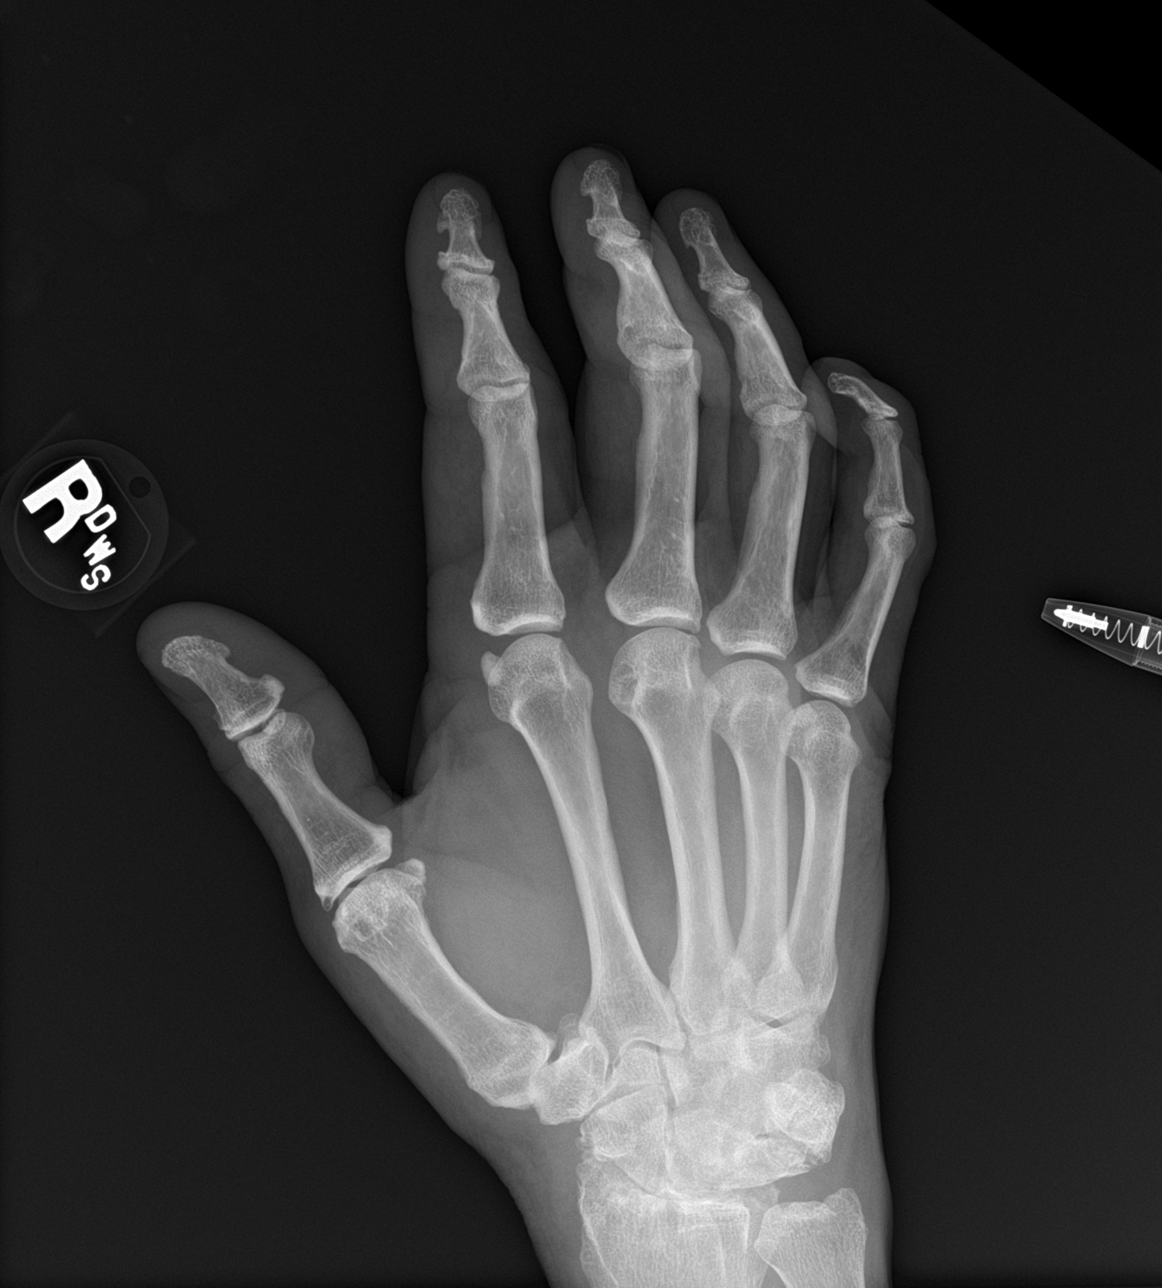

[hand lat]
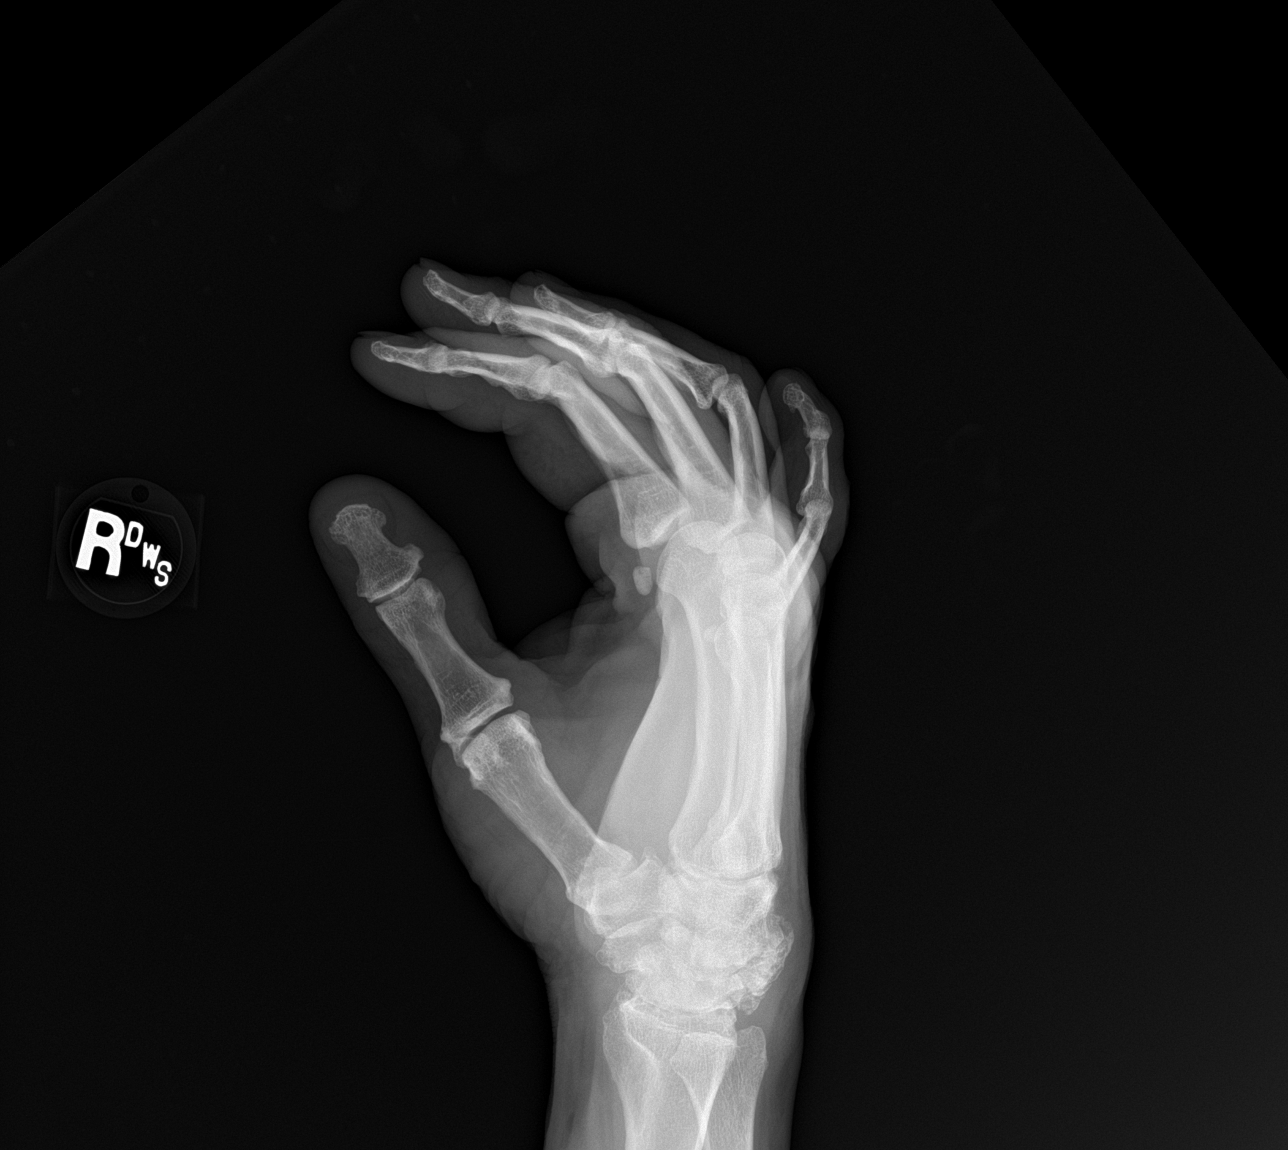

[3 of 3 positions shown; findings below may reference images not displayed]

FINDINGS: There is no evidence of fracture or dislocation. Chronic appearing
deformity involving the fifth distal phalanx is identified. Advanced
degenerative changes are identified involving the radiocarpal joint
with widening of the scapholunate interval suggestive of a SLAC
wrist. Soft tissues are unremarkable.
IMPRESSION: 1. No acute findings.
2. Advanced degenerative changes of the radiocarpal joint with
findings suggestive of SLAC wrist.
3. Chronic deformity involving the fifth distal phalanx.

## 2023-08-13 IMAGING — DX DG FOREARM 2V*R*
2 series · 2 of 2 positions shown · non-contrast
Comparison: None.

CLINICAL DATA: Distal forearm laceration after punching window.

EXAM:
RIGHT FOREARM - 2 VIEW

[forearm ap]
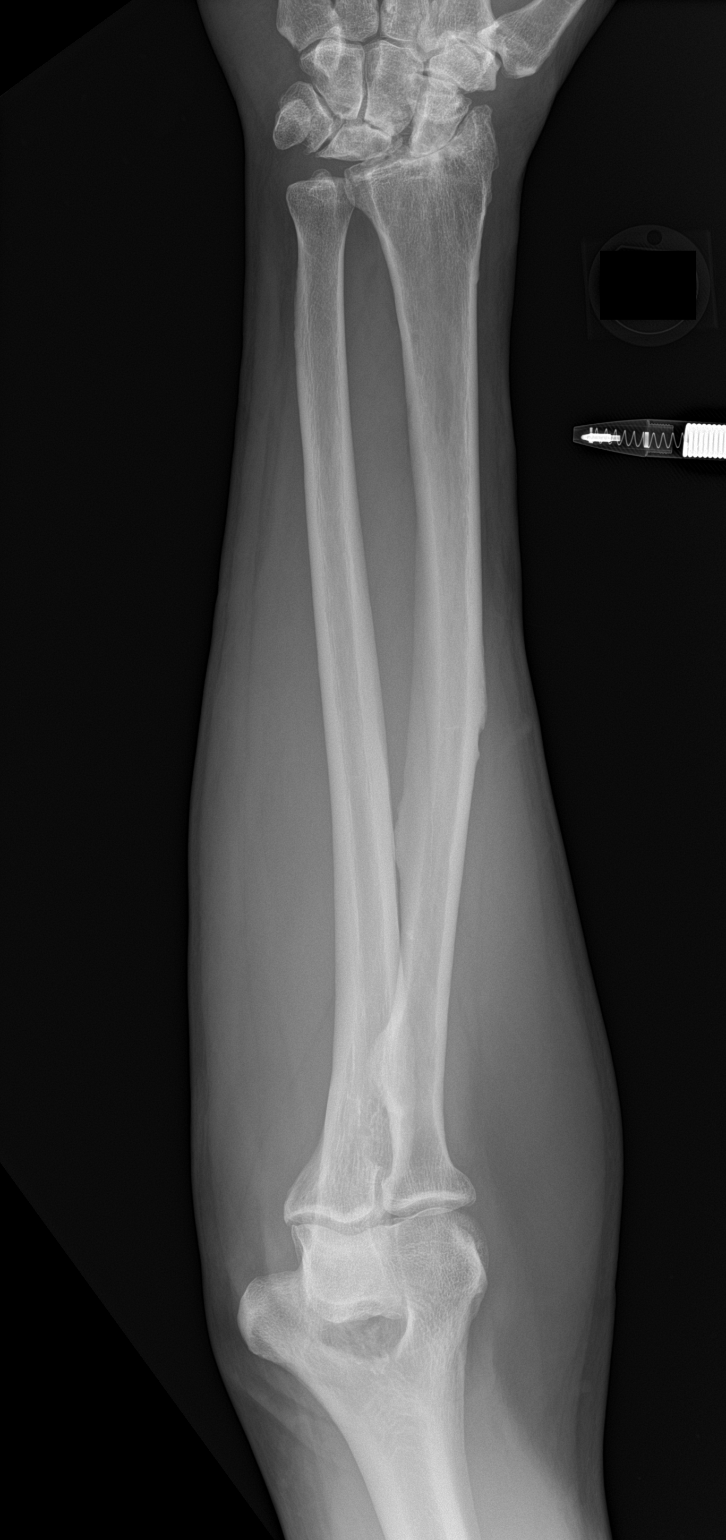

[forearm lat]
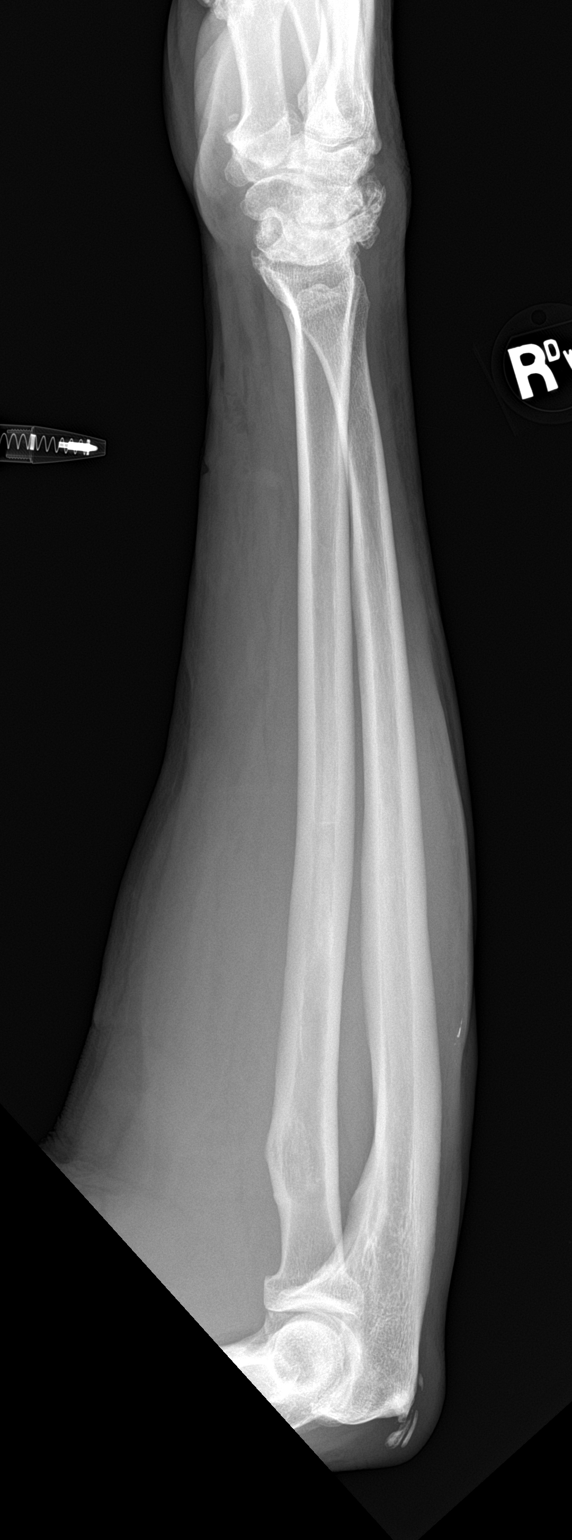

[2 of 2 positions shown; findings below may reference images not displayed]

FINDINGS: No signs of acute fracture or dislocation. Advanced degenerative
changes noted within the wrist. Tiny linear radiodensities within
the superficial soft tissues along the dorsum of the proximal
forearm measure up to 3 mm. Findings may reflect small retained
foreign bodies.
IMPRESSION: 1. No acute bone abnormality.
2. Tiny linear superficial radiodensities within the dorsum of the
forearm suggestive of foreign bodies.

## 2023-08-13 IMAGING — DX DG FOOT COMPLETE 3+V*L*
3 series · 3 of 3 positions shown · non-contrast
Comparison: None.

CLINICAL DATA: puncture wound   ? fb

EXAM:
LEFT FOOT - COMPLETE 3+ VIEW

[foot ap]
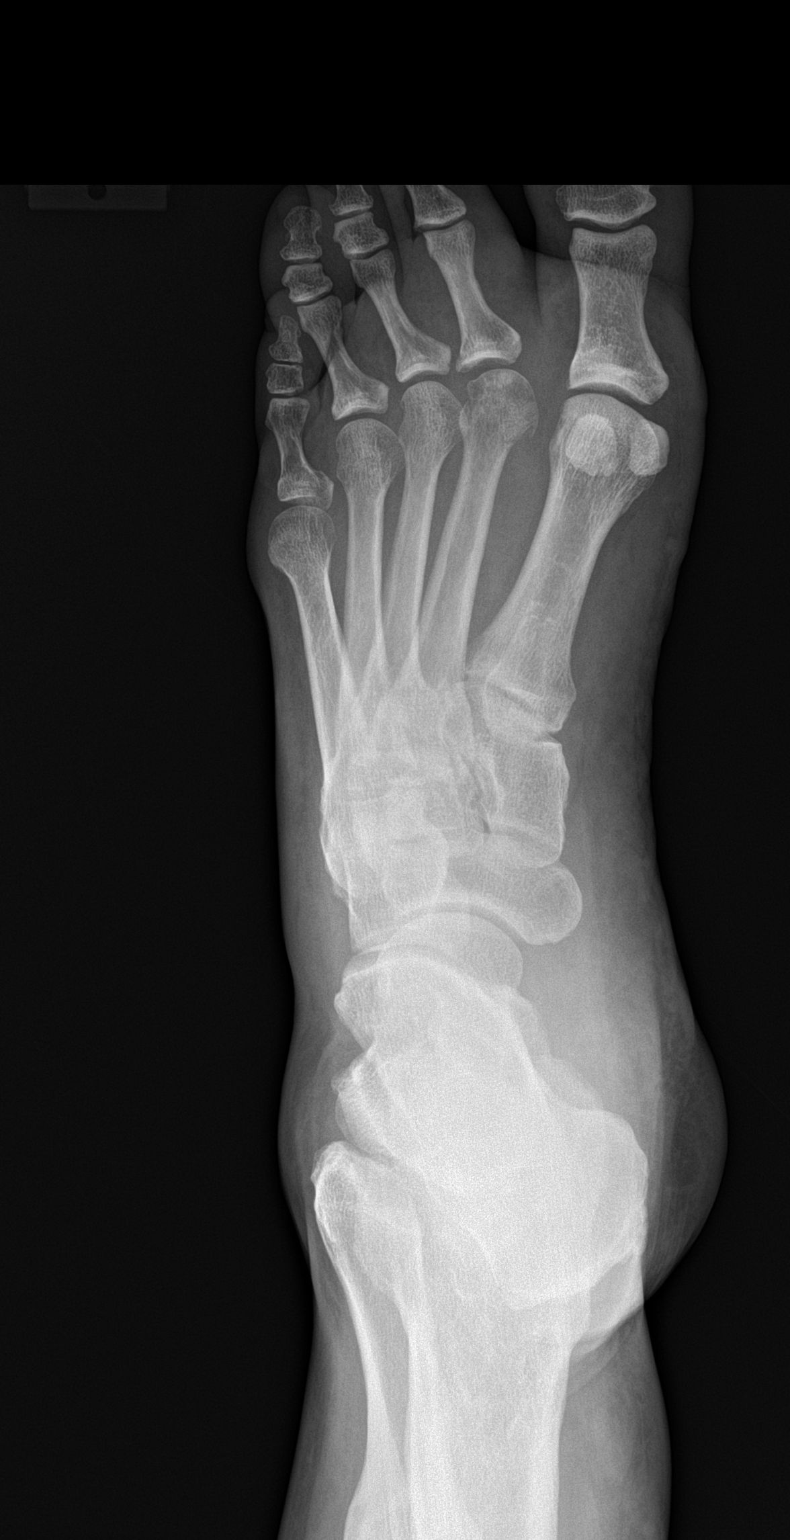

[foot obl]
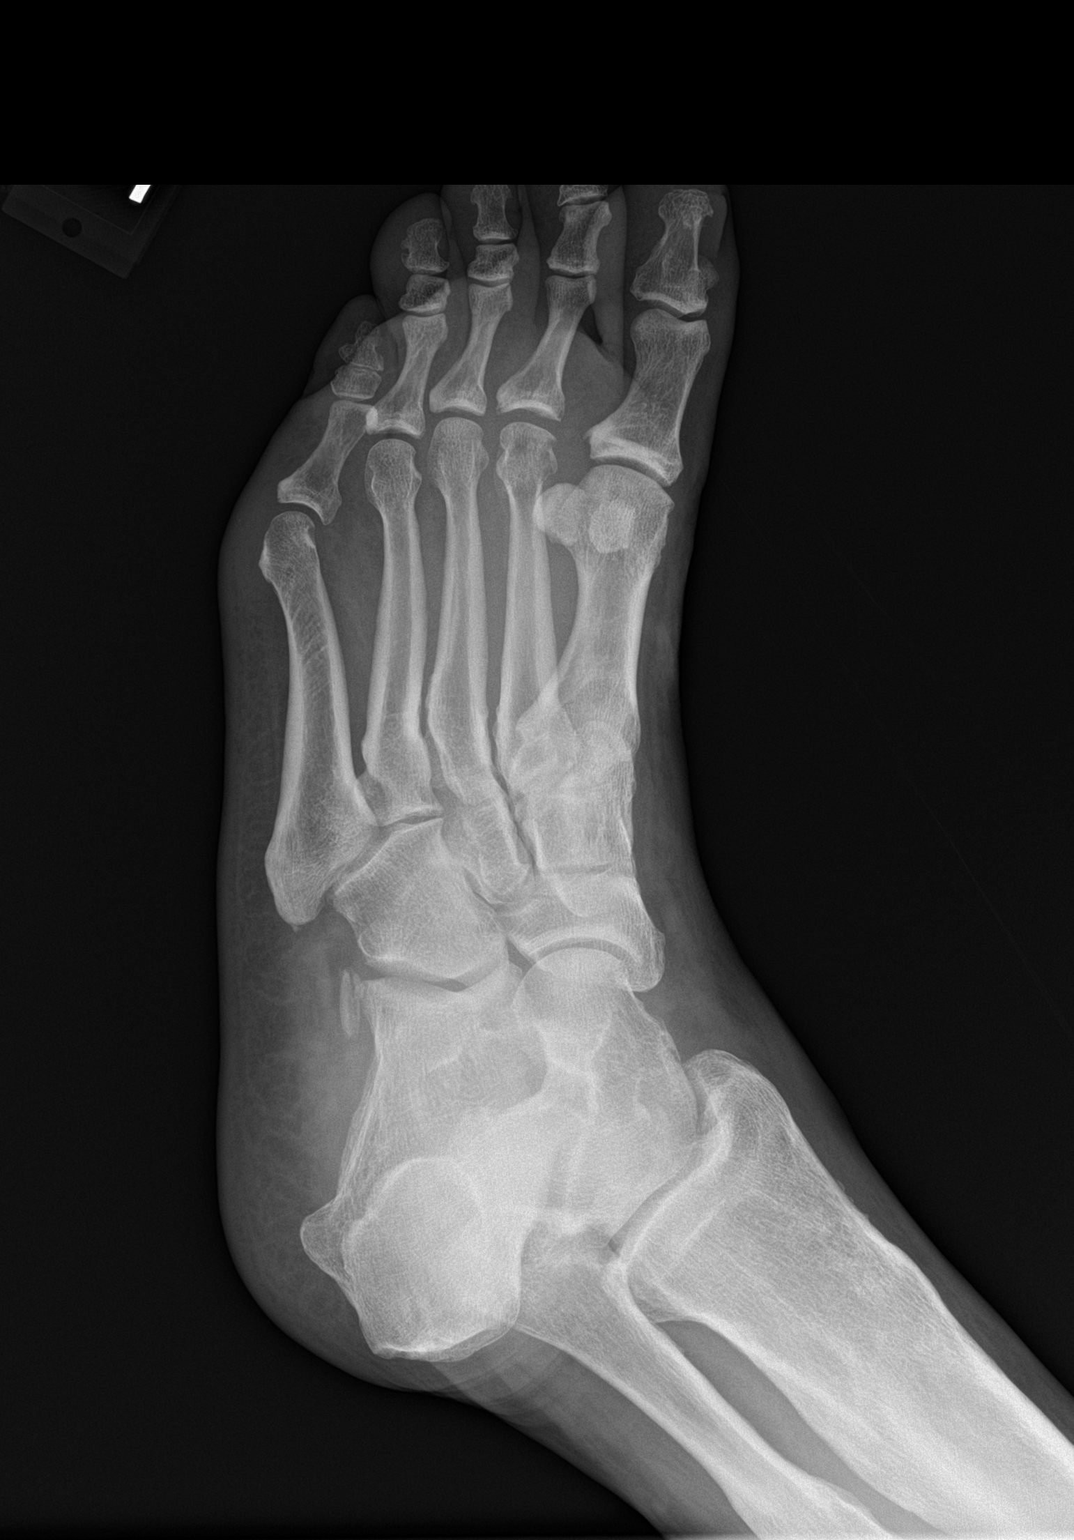

[foot lat]
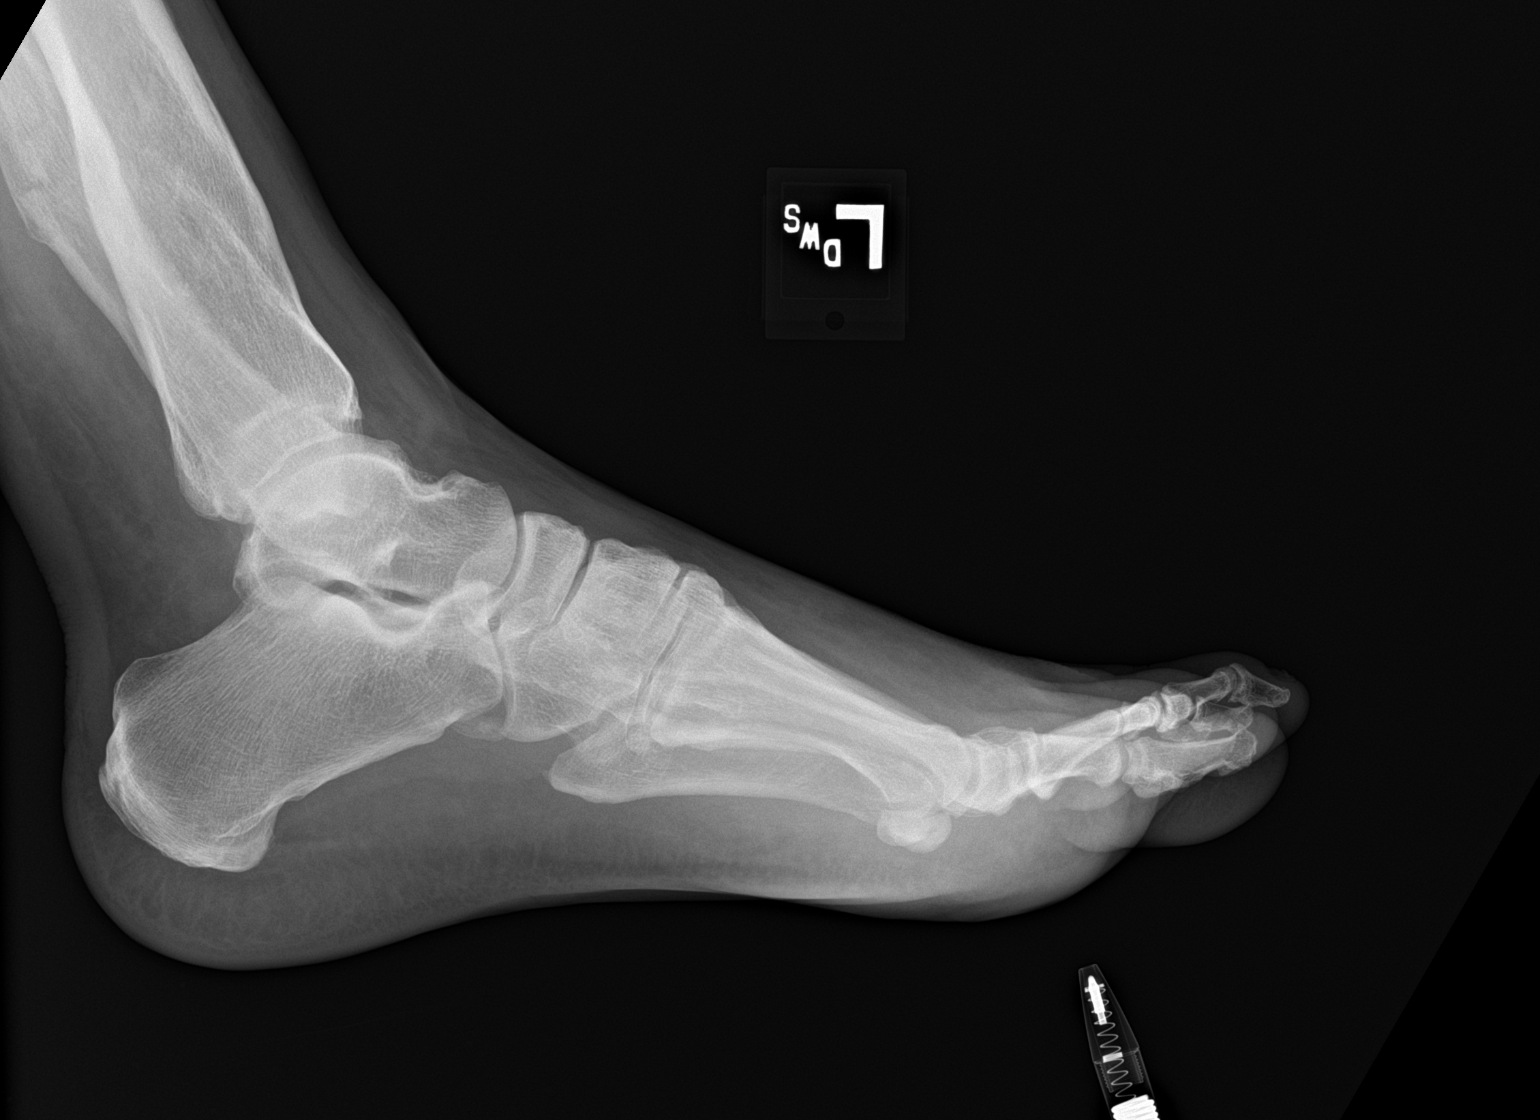

[3 of 3 positions shown; findings below may reference images not displayed]

FINDINGS: There is a nondisplaced fracture of the distal second metatarsal
head/neck. There is no evidence of radiopaque foreign body. There is
a tiny exostosis along the medial aspect of the great toe distal
phalanx. Old healed distal tibia and fibula fractures. Os peroneum.
IMPRESSION: Nondisplaced fracture of the distal second metatarsal head/neck. No
evidence of radiopaque foreign body.

## 2023-10-26 ENCOUNTER — Other Ambulatory Visit: Payer: Self-pay | Admitting: Orthopedic Surgery

## 2023-11-04 ENCOUNTER — Encounter
Admission: RE | Admit: 2023-11-04 | Discharge: 2023-11-04 | Disposition: A | Discharge status: Home / Self Care | Payer: MEDICAID | Source: Ambulatory Visit | Attending: Orthopedic Surgery | Admitting: Orthopedic Surgery

## 2023-11-04 ENCOUNTER — Other Ambulatory Visit: Payer: Self-pay

## 2023-11-04 VITALS — BP 139/84 | HR 71 | Temp 98.3°F | Resp 18 | Ht 69.0 in | Wt 168.7 lb

## 2023-11-04 DIAGNOSIS — Z96652 Presence of left artificial knee joint: Secondary | ICD-10-CM | POA: Diagnosis not present

## 2023-11-04 DIAGNOSIS — Z01812 Encounter for preprocedural laboratory examination: Secondary | ICD-10-CM | POA: Diagnosis present

## 2023-11-04 DIAGNOSIS — E119 Type 2 diabetes mellitus without complications: Secondary | ICD-10-CM

## 2023-11-04 HISTORY — DX: Other diseases of pharynx: J39.2

## 2023-11-04 HISTORY — DX: Family history of other specified conditions: Z84.89

## 2023-11-04 HISTORY — DX: Unspecified osteoarthritis, unspecified site: M19.90

## 2023-11-04 LAB — CBC WITH DIFFERENTIAL/PLATELET
Abs Immature Granulocytes: 0.03 10*3/uL (ref 0.00–0.07)
Basophils Absolute: 0 10*3/uL (ref 0.0–0.1)
Basophils Relative: 1 %
Eosinophils Absolute: 0.2 10*3/uL (ref 0.0–0.5)
Eosinophils Relative: 2 %
HCT: 45 % (ref 39.0–52.0)
Hemoglobin: 15.6 g/dL (ref 13.0–17.0)
Immature Granulocytes: 0 %
Lymphocytes Relative: 31 %
Lymphs Abs: 2.3 10*3/uL (ref 0.7–4.0)
MCH: 30.1 pg (ref 26.0–34.0)
MCHC: 34.7 g/dL (ref 30.0–36.0)
MCV: 86.9 fL (ref 80.0–100.0)
Monocytes Absolute: 0.8 10*3/uL (ref 0.1–1.0)
Monocytes Relative: 10 %
Neutro Abs: 4.3 10*3/uL (ref 1.7–7.7)
Neutrophils Relative %: 56 %
Platelets: 198 10*3/uL (ref 150–400)
RBC: 5.18 MIL/uL (ref 4.22–5.81)
RDW: 13.5 % (ref 11.5–15.5)
WBC: 7.6 10*3/uL (ref 4.0–10.5)
nRBC: 0 % (ref 0.0–0.2)

## 2023-11-04 LAB — URINALYSIS, ROUTINE W REFLEX MICROSCOPIC
Bacteria, UA: NONE SEEN
Bilirubin Urine: NEGATIVE
Glucose, UA: >=500 mg/dL — AB
Hgb urine dipstick: NEGATIVE
Ketones, ur: NEGATIVE mg/dL
Leukocytes,Ua: NEGATIVE
Nitrite: NEGATIVE
Protein, ur: NEGATIVE mg/dL
Specific Gravity, Urine: 1.015 (ref 1.005–1.030)
pH: 5 (ref 5.0–8.0)

## 2023-11-04 LAB — COMPREHENSIVE METABOLIC PANEL
ALT: 13 U/L (ref 0–44)
AST: 15 U/L (ref 15–41)
Albumin: 4.1 g/dL (ref 3.5–5.0)
Alkaline Phosphatase: 71 U/L (ref 38–126)
Anion gap: 10 (ref 5–15)
BUN: 10 mg/dL (ref 6–20)
CO2: 22 mmol/L (ref 22–32)
Calcium: 9.3 mg/dL (ref 8.9–10.3)
Chloride: 100 mmol/L (ref 98–111)
Creatinine, Ser: 0.68 mg/dL (ref 0.61–1.24)
GFR, Estimated: >60 mL/min (ref >60)
Glucose, Bld: 183 mg/dL — ABNORMAL HIGH (ref 70–99)
Potassium: 4 mmol/L (ref 3.5–5.1)
Sodium: 132 mmol/L — ABNORMAL LOW (ref 135–145)
Total Bilirubin: 0.9 mg/dL (ref 0.0–1.2)
Total Protein: 6.7 g/dL (ref 6.5–8.1)

## 2023-11-04 LAB — SURGICAL PCR SCREEN
MRSA, PCR: NEGATIVE
Staphylococcus aureus: NEGATIVE

## 2023-11-04 NOTE — Patient Instructions (Addendum)
Your procedure is scheduled on: Thursday 11/10/23  Report to the Registration Desk on the 1st floor of the [facility]. To find out your arrival time, please call [phone] between 1PM - 3PM on: Wednesday 11/09/23  If your arrival time is 6:00 am, do not arrive before that time as the [facility] entrance doors do not open until 6:00 am.  REMEMBER: Instructions that are not followed completely may result in serious medical risk, up to and including death; or upon the discretion of your surgeon and anesthesiologist your surgery may need to be rescheduled.  Do not eat food after midnight the night before surgery.  No gum chewing or hard candies.  You may however, drink CLEAR liquids up to 2 hours before you are scheduled to arrive for your surgery. Do not drink anything within 2 hours of your scheduled arrival time.  Clear liquids include: - water    In addition, your doctor has ordered for you to drink the provided:   Gatorade G2 Drinking this carbohydrate drink up to two hours before surgery helps to reduce insulin resistance and improve patient outcomes. Please complete drinking 2 hours before scheduled arrival time.  One week prior to surgery: Stop Anti-inflammatories (NSAIDS) such as Advil, Aleve, Ibuprofen, Motrin, Naproxen, Naprosyn and Aspirin based products such as Excedrin, Goody's Powder, BC Powder. Stop ANY OVER THE COUNTER supplements until after surgery. STop metFORMIN (GLUCOPHAGE) 1000 MG  2 days prior to your surgery (take last dose Tuesday 11/08/23) You may however, continue to take Tylenol if needed for pain up until the day of surgery.  Continue taking all of your other prescription medications up until the day of surgery.  ON THE DAY OF SURGERY ONLY TAKE THESE MEDICATIONS WITH SIPS OF WATER:  ARIPiprazole (ABILIFY) 10 MG  gabapentin (NEURONTIN) 300 MG   Use inhalers on the day of surgery and bring to the hospital.  Fleets enema or bowel prep as  directed.  No Alcohol for 24 hours before or after surgery.  No Smoking including e-cigarettes for 24 hours before surgery.  No chewable tobacco products for at least 6 hours before surgery.  No nicotine patches on the day of surgery.  Do not use any "recreational" drugs for at least a week (preferably 2 weeks) before your surgery.  Please be advised that the combination of cocaine and anesthesia may have negative outcomes, up to and including death. If you test positive for cocaine, your surgery will be cancelled.  On the morning of surgery brush your teeth with toothpaste and water, you may rinse your mouth with mouthwash if you wish. Do not swallow any toothpaste or mouthwash.  Use CHG Soap or wipes as directed on instruction sheet.  Do not wear jewelry, make-up, hairpins, clips or nail polish.  For welded (permanent) jewelry: bracelets, anklets, waist bands, etc.  Please have this removed prior to surgery.  If it is not removed, there is a chance that hospital personnel will need to cut it off on the day of surgery.  Do not wear lotions, powders, or perfumes.   Do not shave body hair from the neck down 48 hours before surgery.  Contact lenses, hearing aids and dentures may not be worn into surgery.  Do not bring valuables to the hospital. [facility] is not responsible for any missing/lost belongings or valuables.   Notify your doctor if there is any change in your medical condition (cold, fever, infection).  Wear comfortable clothing (specific to your surgery type)  to the hospital.  After surgery, you can help prevent lung complications by doing breathing exercises.  Take deep breaths and cough every 1-2 hours. Your doctor may order a device called an Incentive Spirometer to help you take deep breaths. When coughing or sneezing, hold a pillow firmly against your incision with both hands. This is called "splinting." Doing this helps protect your incision. It also decreases  belly discomfort.  If you are being admitted to the hospital overnight, leave your suitcase in the car. After surgery it may be brought to your room.  In case of increased patient census, it may be necessary for you, the patient, to continue your postoperative care in the Same Day Surgery department.  If you are being discharged the day of surgery, you will not be allowed to drive home. You will need a responsible individual to drive you home and stay with you for 24 hours after surgery.   If you are taking public transportation, you will need to have a responsible individual with you.  Please call the Pre-admissions Testing Dept. at [phone] if you have any questions about these instructions.  Surgery Visitation Policy:  Patients having surgery or a procedure may have two visitors.  Children under the age of 74 must have an adult with them who is not the patient.  Temporary Visitor Restrictions Due to increasing cases of flu, RSV and COVID-19: Children ages 63 and under will not be able to visit patients in [facility] hospitals under most circumstances.  Inpatient Visitation:    Visiting hours are 7 a.m. to 8 p.m. Up to four visitors are allowed at one time in a patient room. The visitors may rotate out with other people during the day.  One visitor age 108 or older may stay with the patient overnight and must be in the room by 8 p.m.  How to Use an Incentive Spirometer  An incentive spirometer is a tool that measures how well you are filling your lungs with each breath. Learning to take long, deep breaths using this tool can help you keep your lungs clear and active. This may help to reverse or lessen your chance of developing breathing (pulmonary) problems, especially infection. You may be asked to use a spirometer: After a surgery. If you have a lung problem or a history of smoking. After a long period of time when you have been unable to move or be active. If the  spirometer includes an indicator to show the highest number that you have reached, your health care provider or respiratory therapist will help you set a goal. Keep a log of your progress as told by your health care provider. What are the risks? Breathing too quickly may cause dizziness or cause you to pass out. Take your time so you do not get dizzy or light-headed. If you are in pain, you may need to take pain medicine before doing incentive spirometry. It is harder to take a deep breath if you are having pain. How to use your incentive spirometer  Sit up on the edge of your bed or on a chair. Hold the incentive spirometer so that it is in an upright position. Before you use the spirometer, breathe out normally. Place the mouthpiece in your mouth. Make sure your lips are closed tightly around it. Breathe in slowly and as deeply as you can through your mouth, causing the piston or the ball to rise toward the top of the chamber. Hold your breath for  3-5 seconds, or for as long as possible. If the spirometer includes a coach indicator, use this to guide you in breathing. Slow down your breathing if the indicator goes above the marked areas. Remove the mouthpiece from your mouth and breathe out normally. The piston or ball will return to the bottom of the chamber. Rest for a few seconds, then repeat the steps 10 or more times. Take your time and take a few normal breaths between deep breaths so that you do not get dizzy or light-headed. Do this every 1-2 hours when you are awake. If the spirometer includes a goal marker to show the highest number you have reached (best effort), use this as a goal to work toward during each repetition. After each set of 10 deep breaths, cough a few times. This will help to make sure that your lungs are clear. If you have an incision on your chest or abdomen from surgery, place a pillow or a rolled-up towel firmly against the incision when you cough. This can help to  reduce pain while taking deep breaths and coughing. General tips When you are able to get out of bed: Walk around often. Continue to take deep breaths and cough in order to clear your lungs. Keep using the incentive spirometer until your health care provider says it is okay to stop using it. If you have been in the hospital, you may be told to keep using the spirometer at home. Contact a health care provider if: You are having difficulty using the spirometer. You have trouble using the spirometer as often as instructed. Your pain medicine is not giving enough relief for you to use the spirometer as told. You have a fever. Get help right away if: You develop shortness of breath. You develop a cough with bloody mucus from the lungs. You have fluid or blood coming from an incision site after you cough. Summary An incentive spirometer is a tool that can help you learn to take long, deep breaths to keep your lungs clear and active. You may be asked to use a spirometer after a surgery, if you have a lung problem or a history of smoking, or if you have been inactive for a long period of time. Use your incentive spirometer as instructed every 1-2 hours while you are awake. If you have an incision on your chest or abdomen, place a pillow or a rolled-up towel firmly against your incision when you cough. This will help to reduce pain. Get help right away if you have shortness of breath, you cough up bloody mucus, or blood comes from your incision when you cough. This information is not intended to replace advice given to you by your health care provider. Make sure you discuss any questions you have with your health care provider. Document Revised: 12/24/2019 Document Reviewed: 12/24/2019 Elsevier Patient Education  2023 Elsevier Inc.    Please go to the following website to access important education materials concerning your upcoming joint replacement.                                    [email or web address]    Pre-operative 5 CHG Bath Instructions   You can play a key role in reducing the risk of infection after surgery. Your skin needs to be as free of germs as possible. You can reduce the number of germs on your skin by washing with CHG (chlorhexidine gluconate)  soap before surgery. CHG is an antiseptic soap that kills germs and continues to kill germs even after washing.   DO NOT use if you have an allergy to chlorhexidine/CHG or antibacterial soaps. If your skin becomes reddened or irritated, stop using the CHG and notify one of our RNs at [phone].   Please shower with the CHG soap starting 4 days before surgery using the following schedule:     Please keep in mind the following:  DO NOT shave, including legs and underarms, starting the day of your first shower.   You may shave your face at any point before/day of surgery.  Place clean sheets on your bed the day you start using CHG soap. Use a clean washcloth (not used since being washed) for each shower. DO NOT sleep with pets once you start using the CHG.   CHG Shower Instructions:  If you choose to wash your hair and private area, wash first with your normal shampoo/soap.  After you use shampoo/soap, rinse your hair and body thoroughly to remove shampoo/soap residue.  Turn the water OFF and apply about 3 tablespoons (45 ml) of CHG soap to a CLEAN washcloth.  Apply CHG soap ONLY FROM YOUR NECK DOWN TO YOUR TOES (washing for 3-5 minutes)  DO NOT use CHG soap on face, private areas, open wounds, or sores.  Pay special attention to the area where your surgery is being performed.  If you are having back surgery, having someone wash your back for you may be helpful. Wait 2 minutes after CHG soap is applied, then you may rinse off the CHG soap.  Pat dry with a clean towel  Put on clean clothes/pajamas   If you choose to wear lotion, please use ONLY the CHG-compatible lotions on the back of  this paper.     Additional instructions for the day of surgery: DO NOT APPLY any lotions, deodorants, cologne, or perfumes.   Put on clean/comfortable clothes.  Brush your teeth.  Ask your nurse before applying any prescription medications to the skin.      CHG Compatible Lotions   Aveeno Moisturizing lotion  Cetaphil Moisturizing Cream  Cetaphil Moisturizing Lotion  Clairol Herbal Essence Moisturizing Lotion, Dry Skin  Clairol Herbal Essence Moisturizing Lotion, Extra Dry Skin  Clairol Herbal Essence Moisturizing Lotion, Normal Skin  Curel Age Defying Therapeutic Moisturizing Lotion with Alpha Hydroxy  Curel Extreme Care Body Lotion  Curel Soothing Hands Moisturizing Hand Lotion  Curel Therapeutic Moisturizing Cream, Fragrance-Free  Curel Therapeutic Moisturizing Lotion, Fragrance-Free  Curel Therapeutic Moisturizing Lotion, Original Formula  Eucerin Daily Replenishing Lotion  Eucerin Dry Skin Therapy Plus Alpha Hydroxy Crme  Eucerin Dry Skin Therapy Plus Alpha Hydroxy Lotion  Eucerin Original Crme  Eucerin Original Lotion  Eucerin Plus Crme Eucerin Plus Lotion  Eucerin TriLipid Replenishing Lotion  Keri Anti-Bacterial Hand Lotion  Keri Deep Conditioning Original Lotion Dry Skin Formula Softly Scented  Keri Deep Conditioning Original Lotion, Fragrance Free Sensitive Skin Formula  Keri Lotion Fast Absorbing Fragrance Free Sensitive Skin Formula  Keri Lotion Fast Absorbing Softly Scented Dry Skin Formula  Keri Original Lotion  Keri Skin Renewal Lotion Keri Silky Smooth Lotion  Keri Silky Smooth Sensitive Skin Lotion  Nivea Body Creamy Conditioning Oil  Nivea Body Extra Enriched Teacher, adult education Moisturizing Lotion Nivea Crme  Nivea Skin Firming Lotion  NutraDerm 30 Skin Lotion  NutraDerm Skin Lotion  NutraDerm Therapeutic Skin Cream  NutraDerm Therapeutic Skin Lotion  ProShield Protective Hand Cream  Provon moisturizing  lotion

## 2023-11-08 NOTE — Anesthesia Preprocedure Evaluation (Signed)
Anesthesia Evaluation  Patient identified by MRN, date of birth, ID band Patient awake    Reviewed: Allergy & Precautions, H&P , NPO status , Patient's Chart, lab work & pertinent test results, reviewed documented beta blocker date and time   History of Anesthesia Complications Negative for: history of anesthetic complications  Airway Mallampati: III  TM Distance: >3 FB Neck ROM: full    Dental  (+) Dental Advidsory Given, Chipped, Teeth Intact   Pulmonary neg shortness of breath, neg sleep apnea, COPD (mild, no inhaler), neg recent URI, Patient abstained from smoking., former smoker   Pulmonary exam normal breath sounds clear to auscultation       Cardiovascular Exercise Tolerance: Good hypertension, (-) angina (-) Past MI and (-) Cardiac Stents Normal cardiovascular exam(-) dysrhythmias (-) Valvular Problems/Murmurs Rhythm:regular Rate:Normal     Neuro/Psych  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)  negative psych ROS   GI/Hepatic negative GI ROS, Neg liver ROS,,,  Endo/Other  diabetes, Well Controlled, Type 2    Renal/GU negative Renal ROS  negative genitourinary   Musculoskeletal  (+) Arthritis ,    Abdominal Normal abdominal exam  (+)   Peds  Hematology negative hematology ROS (+)   Anesthesia Other Findings Past Medical History: 1989: Brain injury (HCC) No date: Diabetes (HCC) No date: Hypertension   Reproductive/Obstetrics negative OB ROS                             Anesthesia Physical Anesthesia Plan  ASA: 2  Anesthesia Plan: Spinal   Post-op Pain Management: Ofirmev IV (intra-op)* and Toradol IV (intra-op)*   Induction: Intravenous  PONV Risk Score and Plan: 1 and Propofol infusion and TIVA  Airway Management Planned: Natural Airway and Nasal Cannula  Additional Equipment:   Intra-op Plan:   Post-operative Plan:   Informed Consent: I have reviewed  the patients History and Physical, chart, labs and discussed the procedure including the risks, benefits and alternatives for the proposed anesthesia with the patient or authorized representative who has indicated his/her understanding and acceptance.     Dental Advisory Given  Plan Discussed with: Anesthesiologist, CRNA and Surgeon  Anesthesia Plan Comments:         Anesthesia Quick Evaluation

## 2023-11-10 ENCOUNTER — Ambulatory Visit: Payer: MEDICAID | Admitting: Anesthesiology

## 2023-11-10 ENCOUNTER — Ambulatory Visit: Payer: MEDICAID | Admitting: Urgent Care

## 2023-11-10 ENCOUNTER — Encounter
Admission: RE | Disposition: A | Discharge status: Home Health | Payer: Self-pay | Source: Home / Self Care | Attending: Orthopedic Surgery

## 2023-11-10 ENCOUNTER — Other Ambulatory Visit: Payer: Self-pay

## 2023-11-10 ENCOUNTER — Encounter: Payer: Self-pay | Admitting: Orthopedic Surgery

## 2023-11-10 ENCOUNTER — Observation Stay
Admission: RE | Admit: 2023-11-10 | Discharge: 2023-11-11 | Disposition: A | Discharge status: Home Health | Payer: MEDICAID | Attending: Orthopedic Surgery | Admitting: Orthopedic Surgery

## 2023-11-10 ENCOUNTER — Ambulatory Visit: Payer: MEDICAID

## 2023-11-10 DIAGNOSIS — M1711 Unilateral primary osteoarthritis, right knee: Secondary | ICD-10-CM | POA: Diagnosis present

## 2023-11-10 DIAGNOSIS — Z79899 Other long term (current) drug therapy: Secondary | ICD-10-CM | POA: Diagnosis not present

## 2023-11-10 DIAGNOSIS — Z96651 Presence of right artificial knee joint: Principal | ICD-10-CM

## 2023-11-10 DIAGNOSIS — Z96652 Presence of left artificial knee joint: Secondary | ICD-10-CM | POA: Insufficient documentation

## 2023-11-10 DIAGNOSIS — Z7984 Long term (current) use of oral hypoglycemic drugs: Secondary | ICD-10-CM | POA: Diagnosis not present

## 2023-11-10 DIAGNOSIS — I10 Essential (primary) hypertension: Secondary | ICD-10-CM | POA: Insufficient documentation

## 2023-11-10 DIAGNOSIS — Z87891 Personal history of nicotine dependence: Secondary | ICD-10-CM | POA: Diagnosis not present

## 2023-11-10 DIAGNOSIS — E119 Type 2 diabetes mellitus without complications: Secondary | ICD-10-CM | POA: Diagnosis not present

## 2023-11-10 HISTORY — PX: TOTAL KNEE ARTHROPLASTY: SHX125

## 2023-11-10 LAB — GLUCOSE, CAPILLARY
Glucose-Capillary: 121 mg/dL — ABNORMAL HIGH (ref 70–99)
Glucose-Capillary: 186 mg/dL — ABNORMAL HIGH (ref 70–99)
Glucose-Capillary: 276 mg/dL — ABNORMAL HIGH (ref 70–99)
Glucose-Capillary: 211 mg/dL — ABNORMAL HIGH (ref 70–99)

## 2023-11-10 SURGERY — ARTHROPLASTY, KNEE, TOTAL
Anesthesia: Spinal | Site: Knee | Laterality: Right

## 2023-11-10 MED ORDER — BUPIVACAINE LIPOSOME 1.3 % IJ SUSP
INTRAMUSCULAR | Status: AC
  Filled 2023-11-10: qty 20

## 2023-11-10 MED ORDER — ONDANSETRON HCL 4 MG PO TABS: 4.0000 mg | ORAL_TABLET | Freq: Four times a day (QID) | ORAL | Status: DC | PRN

## 2023-11-10 MED ORDER — DEXAMETHASONE SODIUM PHOSPHATE 10 MG/ML IJ SOLN
8.0000 mg | Freq: Once | INTRAMUSCULAR | Status: AC
  Administered 2023-11-10: 8 mg via INTRAVENOUS

## 2023-11-10 MED ORDER — MIDAZOLAM HCL 2 MG/2ML IJ SOLN
INTRAMUSCULAR | Status: AC
  Filled 2023-11-10: qty 2

## 2023-11-10 MED ORDER — ORAL CARE MOUTH RINSE
15.0000 mL | Freq: Once | OROMUCOSAL | Status: AC
Start: 2023-11-10 — End: 2023-11-10

## 2023-11-10 MED ORDER — ACETAMINOPHEN 325 MG PO TABS
325.0000 mg | ORAL_TABLET | Freq: Four times a day (QID) | ORAL | Status: DC | PRN
Start: 2023-11-11 — End: 2023-11-11

## 2023-11-10 MED ORDER — GABAPENTIN 300 MG PO CAPS
300.0000 mg | ORAL_CAPSULE | Freq: Three times a day (TID) | ORAL | Status: DC
Start: 2023-11-10 — End: 2023-11-11
  Administered 2023-11-10 – 2023-11-11 (×3): 300 mg via ORAL

## 2023-11-10 MED ORDER — MORPHINE SULFATE (PF) 4 MG/ML IV SOLN: 0.5000 mg | INTRAVENOUS | Status: DC | PRN

## 2023-11-10 MED ORDER — GABAPENTIN 300 MG PO CAPS
ORAL_CAPSULE | ORAL | Status: AC
  Filled 2023-11-10: qty 1

## 2023-11-10 MED ORDER — EPHEDRINE SULFATE-NACL 50-0.9 MG/10ML-% IV SOSY
PREFILLED_SYRINGE | INTRAVENOUS | Status: DC | PRN
  Administered 2023-11-10: 10 mg via INTRAVENOUS

## 2023-11-10 MED ORDER — PANTOPRAZOLE SODIUM 40 MG PO TBEC
40.0000 mg | DELAYED_RELEASE_TABLET | Freq: Every day | ORAL | Status: DC
  Administered 2023-11-10 – 2023-11-11 (×2): 40 mg via ORAL

## 2023-11-10 MED ORDER — DEXAMETHASONE SODIUM PHOSPHATE 10 MG/ML IJ SOLN
INTRAMUSCULAR | Status: AC
  Filled 2023-11-10: qty 1

## 2023-11-10 MED ORDER — KETOROLAC TROMETHAMINE 15 MG/ML IJ SOLN
INTRAMUSCULAR | Status: AC
  Filled 2023-11-10: qty 1

## 2023-11-10 MED ORDER — GLYCOPYRROLATE 0.2 MG/ML IJ SOLN
INTRAMUSCULAR | Status: DC | PRN
  Administered 2023-11-10: .2 mg via INTRAVENOUS

## 2023-11-10 MED ORDER — TRAMADOL HCL 50 MG PO TABS
50.0000 mg | ORAL_TABLET | Freq: Four times a day (QID) | ORAL | Status: DC | PRN
Start: 2023-11-10 — End: 2023-11-11
  Administered 2023-11-11: 50 mg via ORAL

## 2023-11-10 MED ORDER — PROPOFOL 500 MG/50ML IV EMUL
INTRAVENOUS | Status: DC | PRN
  Administered 2023-11-10: 100 ug/kg/min via INTRAVENOUS

## 2023-11-10 MED ORDER — MIDAZOLAM HCL 5 MG/5ML IJ SOLN
INTRAMUSCULAR | Status: DC | PRN
  Administered 2023-11-10: 2 mg via INTRAVENOUS

## 2023-11-10 MED ORDER — DOCUSATE SODIUM 100 MG PO CAPS
ORAL_CAPSULE | ORAL | Status: AC
  Filled 2023-11-10: qty 1

## 2023-11-10 MED ORDER — OXYCODONE HCL 5 MG/5ML PO SOLN
5.0000 mg | Freq: Once | ORAL | Status: DC | PRN
Start: 2023-11-10 — End: 2023-11-10

## 2023-11-10 MED ORDER — TRANEXAMIC ACID-NACL 1000-0.7 MG/100ML-% IV SOLN
1000.0000 mg | INTRAVENOUS | Status: AC
  Administered 2023-11-10 (×2): 1000 mg via INTRAVENOUS

## 2023-11-10 MED ORDER — INSULIN ASPART 100 UNIT/ML IJ SOLN
INTRAMUSCULAR | Status: AC
  Filled 2023-11-10: qty 1

## 2023-11-10 MED ORDER — TRANEXAMIC ACID-NACL 1000-0.7 MG/100ML-% IV SOLN
INTRAVENOUS | Status: AC
  Filled 2023-11-10: qty 100

## 2023-11-10 MED ORDER — KETAMINE HCL 50 MG/5ML IJ SOSY
PREFILLED_SYRINGE | INTRAMUSCULAR | Status: AC
  Filled 2023-11-10: qty 5

## 2023-11-10 MED ORDER — LISINOPRIL 5 MG PO TABS
10.0000 mg | ORAL_TABLET | Freq: Every day | ORAL | Status: DC
  Administered 2023-11-11: 10 mg via ORAL

## 2023-11-10 MED ORDER — CEFAZOLIN SODIUM-DEXTROSE 2-4 GM/100ML-% IV SOLN
INTRAVENOUS | Status: AC
  Filled 2023-11-10: qty 100

## 2023-11-10 MED ORDER — ONDANSETRON HCL 4 MG/2ML IJ SOLN
INTRAMUSCULAR | Status: DC | PRN
  Administered 2023-11-10: 4 mg via INTRAVENOUS

## 2023-11-10 MED ORDER — DEXMEDETOMIDINE HCL IN NACL 80 MCG/20ML IV SOLN
INTRAVENOUS | Status: DC | PRN
  Administered 2023-11-10: 12 ug via INTRAVENOUS

## 2023-11-10 MED ORDER — ARIPIPRAZOLE 10 MG PO TABS
10.0000 mg | ORAL_TABLET | Freq: Every day | ORAL | Status: DC
  Administered 2023-11-10: 10 mg via ORAL
  Filled 2023-11-10 (×2): qty 1

## 2023-11-10 MED ORDER — FENTANYL CITRATE (PF) 100 MCG/2ML IJ SOLN: 25.0000 ug | INTRAMUSCULAR | Status: DC | PRN

## 2023-11-10 MED ORDER — GABAPENTIN 100 MG PO CAPS
ORAL_CAPSULE | ORAL | Status: AC
Start: 2023-11-10 — End: ?
  Filled 2023-11-10: qty 3

## 2023-11-10 MED ORDER — BUPIVACAINE-EPINEPHRINE (PF) 0.25% -1:200000 IJ SOLN
INTRAMUSCULAR | Status: AC
  Filled 2023-11-10: qty 30

## 2023-11-10 MED ORDER — BUPIVACAINE HCL (PF) 0.5 % IJ SOLN
INTRAMUSCULAR | Status: DC | PRN
  Administered 2023-11-10: 2.2 mL

## 2023-11-10 MED ORDER — PHENYLEPHRINE HCL-NACL 20-0.9 MG/250ML-% IV SOLN
INTRAVENOUS | Status: DC | PRN
  Administered 2023-11-10: 30 ug/min via INTRAVENOUS

## 2023-11-10 MED ORDER — PROPOFOL 10 MG/ML IV BOLUS
INTRAVENOUS | Status: AC
  Filled 2023-11-10: qty 20

## 2023-11-10 MED ORDER — SODIUM CHLORIDE 0.9 % IR SOLN
Status: DC | PRN
  Administered 2023-11-10: 500 mL

## 2023-11-10 MED ORDER — PHENOL 1.4 % MT LIQD: 1.0000 | OROMUCOSAL | Status: DC | PRN

## 2023-11-10 MED ORDER — HYDROCODONE-ACETAMINOPHEN 5-325 MG PO TABS
ORAL_TABLET | ORAL | Status: AC
  Filled 2023-11-10: qty 1

## 2023-11-10 MED ORDER — SODIUM CHLORIDE 0.9 % IV SOLN: INTRAVENOUS | Status: DC

## 2023-11-10 MED ORDER — PHENYLEPHRINE HCL-NACL 20-0.9 MG/250ML-% IV SOLN
INTRAVENOUS | Status: AC
  Filled 2023-11-10: qty 250

## 2023-11-10 MED ORDER — CEFAZOLIN SODIUM-DEXTROSE 2-4 GM/100ML-% IV SOLN
2.0000 g | INTRAVENOUS | Status: AC
  Administered 2023-11-10: 2 g via INTRAVENOUS

## 2023-11-10 MED ORDER — METOCLOPRAMIDE HCL 5 MG PO TABS
5.0000 mg | ORAL_TABLET | Freq: Three times a day (TID) | ORAL | Status: DC | PRN
Start: 2023-11-10 — End: 2023-11-11

## 2023-11-10 MED ORDER — METOCLOPRAMIDE HCL 5 MG/ML IJ SOLN: 5.0000 mg | Freq: Three times a day (TID) | INTRAMUSCULAR | Status: DC | PRN

## 2023-11-10 MED ORDER — PANTOPRAZOLE SODIUM 40 MG PO TBEC
DELAYED_RELEASE_TABLET | ORAL | Status: AC
  Filled 2023-11-10: qty 1

## 2023-11-10 MED ORDER — PHENYLEPHRINE 80 MCG/ML (10ML) SYRINGE FOR IV PUSH (FOR BLOOD PRESSURE SUPPORT)
PREFILLED_SYRINGE | INTRAVENOUS | Status: DC | PRN
  Administered 2023-11-10 (×3): 80 ug via INTRAVENOUS

## 2023-11-10 MED ORDER — SURGIPHOR WOUND IRRIGATION SYSTEM - OPTIME
TOPICAL | Status: DC | PRN
  Administered 2023-11-10: 450 mL via TOPICAL

## 2023-11-10 MED ORDER — FENTANYL CITRATE (PF) 100 MCG/2ML IJ SOLN
INTRAMUSCULAR | Status: AC
  Filled 2023-11-10: qty 2

## 2023-11-10 MED ORDER — SODIUM CHLORIDE 0.9% FLUSH: 3.0000 mL | Freq: Two times a day (BID) | INTRAVENOUS | Status: DC

## 2023-11-10 MED ORDER — TRAZODONE HCL 100 MG PO TABS
200.0000 mg | ORAL_TABLET | Freq: Every day | ORAL | Status: DC
  Administered 2023-11-10: 200 mg via ORAL

## 2023-11-10 MED ORDER — HYDROCODONE-ACETAMINOPHEN 5-325 MG PO TABS
1.0000 | ORAL_TABLET | ORAL | Status: DC | PRN
Start: 2023-11-10 — End: 2023-11-11
  Administered 2023-11-10: 1 via ORAL
  Administered 2023-11-11: 2 via ORAL

## 2023-11-10 MED ORDER — MENTHOL 3 MG MT LOZG: 1.0000 | LOZENGE | OROMUCOSAL | Status: DC | PRN

## 2023-11-10 MED ORDER — ACETAMINOPHEN 10 MG/ML IV SOLN
INTRAVENOUS | Status: AC
  Filled 2023-11-10: qty 100

## 2023-11-10 MED ORDER — CHLORHEXIDINE GLUCONATE 0.12 % MT SOLN
OROMUCOSAL | Status: AC
  Filled 2023-11-10: qty 15

## 2023-11-10 MED ORDER — SODIUM CHLORIDE FLUSH 0.9 % IV SOLN
INTRAVENOUS | Status: AC
  Filled 2023-11-10: qty 20

## 2023-11-10 MED ORDER — CEFAZOLIN SODIUM-DEXTROSE 2-4 GM/100ML-% IV SOLN
2.0000 g | Freq: Four times a day (QID) | INTRAVENOUS | Status: AC
Start: 2023-11-10 — End: 2023-11-10
  Administered 2023-11-10 (×2): 2 g via INTRAVENOUS

## 2023-11-10 MED ORDER — DOCUSATE SODIUM 100 MG PO CAPS
100.0000 mg | ORAL_CAPSULE | Freq: Two times a day (BID) | ORAL | Status: DC
  Administered 2023-11-10 – 2023-11-11 (×2): 100 mg via ORAL

## 2023-11-10 MED ORDER — SODIUM CHLORIDE (PF) 0.9 % IJ SOLN
INTRAMUSCULAR | Status: DC | PRN
  Administered 2023-11-10: 71 mL

## 2023-11-10 MED ORDER — INSULIN ASPART 100 UNIT/ML IJ SOLN
0.0000 [IU] | Freq: Three times a day (TID) | INTRAMUSCULAR | Status: DC
  Administered 2023-11-10: 8 [IU] via SUBCUTANEOUS
  Administered 2023-11-11: 2 [IU] via SUBCUTANEOUS

## 2023-11-10 MED ORDER — PROPOFOL 10 MG/ML IV BOLUS
INTRAVENOUS | Status: DC | PRN
  Administered 2023-11-10: 20 mg via INTRAVENOUS

## 2023-11-10 MED ORDER — ENOXAPARIN SODIUM 30 MG/0.3ML IJ SOSY
30.0000 mg | PREFILLED_SYRINGE | Freq: Two times a day (BID) | INTRAMUSCULAR | Status: DC
  Administered 2023-11-11: 30 mg via SUBCUTANEOUS

## 2023-11-10 MED ORDER — INSULIN ASPART 100 UNIT/ML IJ SOLN
0.0000 [IU] | Freq: Every day | INTRAMUSCULAR | Status: DC
  Administered 2023-11-10: 2 [IU] via SUBCUTANEOUS

## 2023-11-10 MED ORDER — ACETAMINOPHEN 10 MG/ML IV SOLN
INTRAVENOUS | Status: DC | PRN
  Administered 2023-11-10: 1000 mg via INTRAVENOUS

## 2023-11-10 MED ORDER — KETAMINE HCL 10 MG/ML IJ SOLN
INTRAMUSCULAR | Status: DC | PRN
  Administered 2023-11-10: 30 mg via INTRAVENOUS
  Administered 2023-11-10: 20 mg via INTRAVENOUS

## 2023-11-10 MED ORDER — OXYCODONE HCL 5 MG PO TABS: 5.0000 mg | ORAL_TABLET | Freq: Once | ORAL | Status: DC | PRN

## 2023-11-10 MED ORDER — ONDANSETRON HCL 4 MG/2ML IJ SOLN: 4.0000 mg | Freq: Four times a day (QID) | INTRAMUSCULAR | Status: DC | PRN

## 2023-11-10 MED ORDER — SODIUM CHLORIDE 0.9% FLUSH: 3.0000 mL | INTRAVENOUS | Status: DC | PRN

## 2023-11-10 MED ORDER — TRAZODONE HCL 100 MG PO TABS
ORAL_TABLET | ORAL | Status: AC
  Filled 2023-11-10: qty 2

## 2023-11-10 MED ORDER — KETOROLAC TROMETHAMINE 15 MG/ML IJ SOLN
7.5000 mg | Freq: Four times a day (QID) | INTRAMUSCULAR | Status: AC
  Administered 2023-11-10 – 2023-11-11 (×4): 7.5 mg via INTRAVENOUS

## 2023-11-10 MED ORDER — CHLORHEXIDINE GLUCONATE 0.12 % MT SOLN
15.0000 mL | Freq: Once | OROMUCOSAL | Status: AC
  Administered 2023-11-10: 15 mL via OROMUCOSAL

## 2023-11-10 MED ORDER — DROPERIDOL 2.5 MG/ML IJ SOLN: 0.6250 mg | Freq: Once | INTRAMUSCULAR | Status: DC | PRN

## 2023-11-10 MED ORDER — ACETAMINOPHEN 500 MG PO TABS
1000.0000 mg | ORAL_TABLET | Freq: Three times a day (TID) | ORAL | Status: DC
  Administered 2023-11-11: 1000 mg via ORAL

## 2023-11-10 MED ORDER — ACETAMINOPHEN 500 MG PO TABS
ORAL_TABLET | ORAL | Status: AC
Start: 2023-11-10 — End: ?
  Filled 2023-11-10: qty 2

## 2023-11-10 MED ORDER — ACETAMINOPHEN 10 MG/ML IV SOLN
1000.0000 mg | Freq: Once | INTRAVENOUS | Status: DC | PRN
Start: 2023-11-10 — End: 2023-11-10

## 2023-11-10 MED ORDER — FENTANYL CITRATE (PF) 100 MCG/2ML IJ SOLN
INTRAMUSCULAR | Status: DC | PRN
  Administered 2023-11-10 (×2): 50 ug via INTRAVENOUS

## 2023-11-10 MED ORDER — PROPOFOL 1000 MG/100ML IV EMUL
INTRAVENOUS | Status: AC
  Filled 2023-11-10: qty 100

## 2023-11-10 SURGICAL SUPPLY — 66 items
BLADE PATELLA W-PILOT HOLE 35 (BLADE) IMPLANT
BLADE SAGITTAL AGGR TOOTH XLG (BLADE) IMPLANT
BLADE SAW SAG 25X90X1.19 (BLADE) ×1 IMPLANT
BLADE SAW SAG 29X58X.64 (BLADE) ×1 IMPLANT
BNDG ELASTIC 6INX 5YD STR LF (GAUZE/BANDAGES/DRESSINGS) ×1 IMPLANT
BRUSH SCRUB EZ PLAIN DRY (MISCELLANEOUS) ×1 IMPLANT
CHLORAPREP W/TINT 26 (MISCELLANEOUS) ×2 IMPLANT
COMP FEM STD PS KNEE 7 RT (Joint) ×1 IMPLANT
COMP PATELLA PEG 3 32 (Joint) ×1 IMPLANT
COMPONENT FEM STD PS KNEE 7 RT (Joint) IMPLANT
COMPONENT PATELLA PEG 3 32 (Joint) IMPLANT
COOLER POLAR GLACIER W/PUMP (MISCELLANEOUS) ×1 IMPLANT
CUFF TRNQT CYL 24X4X16.5-23 (TOURNIQUET CUFF) IMPLANT
CUFF TRNQT CYL 30X4X21-28X (TOURNIQUET CUFF) IMPLANT
DERMABOND ADVANCED .7 DNX12 (GAUZE/BANDAGES/DRESSINGS) IMPLANT
DRAPE SHEET LG 3/4 BI-LAMINATE (DRAPES) ×1 IMPLANT
DRSG MEPILEX SACRM 8.7X9.8 (GAUZE/BANDAGES/DRESSINGS) ×1 IMPLANT
DRSG OPSITE POSTOP 4X10 (GAUZE/BANDAGES/DRESSINGS) IMPLANT
DRSG OPSITE POSTOP 4X8 (GAUZE/BANDAGES/DRESSINGS) IMPLANT
ELECT REM PT RETURN 9FT ADLT (ELECTROSURGICAL) ×1
ELECTRODE REM PT RTRN 9FT ADLT (ELECTROSURGICAL) ×1 IMPLANT
GLOVE BIO SURGEON STRL SZ8 (GLOVE) ×1 IMPLANT
GLOVE BIOGEL PI IND STRL 8 (GLOVE) ×1 IMPLANT
GLOVE PI ORTHO PRO STRL 7.5 (GLOVE) ×2 IMPLANT
GLOVE PI ORTHO PRO STRL SZ8 (GLOVE) ×2 IMPLANT
GLOVE SURG SYN 7.5 E (GLOVE) ×1
GLOVE SURG SYN 7.5 PF PI (GLOVE) ×1 IMPLANT
GOWN SRG XL LVL 3 NONREINFORCE (GOWNS) ×1 IMPLANT
GOWN STRL REUS W/ TWL LRG LVL3 (GOWN DISPOSABLE) ×1 IMPLANT
GOWN STRL REUS W/ TWL XL LVL3 (GOWN DISPOSABLE) ×1 IMPLANT
HOOD PEEL AWAY T7 (MISCELLANEOUS) ×2 IMPLANT
INSERT TIB ASF SZ 6-7/EF 10 RT (Insert) IMPLANT
IV NS IRRIG 3000ML ARTHROMATIC (IV SOLUTION) ×1 IMPLANT
KIT TURNOVER KIT A (KITS) ×1 IMPLANT
MANIFOLD NEPTUNE II (INSTRUMENTS) ×1 IMPLANT
MARKER SKIN DUAL TIP RULER LAB (MISCELLANEOUS) ×1 IMPLANT
MAT ABSORB FLUID 56X50 GRAY (MISCELLANEOUS) ×1 IMPLANT
NDL HYPO 21X1.5 SAFETY (NEEDLE) ×1 IMPLANT
NEEDLE HYPO 21X1.5 SAFETY (NEEDLE) ×1
PACK TOTAL KNEE (MISCELLANEOUS) ×1 IMPLANT
PAD ARMBOARD 7.5X6 YLW CONV (MISCELLANEOUS) ×3 IMPLANT
PAD WRAPON POLAR KNEE (MISCELLANEOUS) ×1 IMPLANT
PENCIL SMOKE EVACUATOR (MISCELLANEOUS) ×1 IMPLANT
PIN DRILL HDLS TROCAR 75 4PK (PIN) IMPLANT
PROS TIB KNEE PS 0D F RT (Joint) ×1 IMPLANT
PROSTHESIS TIB KNEE PS 0D F RT (Joint) IMPLANT
PULSAVAC PLUS IRRIG FAN TIP (DISPOSABLE) ×1
SCREW FEMALE HEX FIX 25X2.5 (ORTHOPEDIC DISPOSABLE SUPPLIES) IMPLANT
SCREW HEX HEADED 3.5X27 DISP (ORTHOPEDIC DISPOSABLE SUPPLIES) IMPLANT
SLEEVE SCD COMPRESS KNEE MED (STOCKING) ×1 IMPLANT
SOLUTION IRRIG SURGIPHOR (IV SOLUTION) ×1 IMPLANT
SUT STRATA 1 CT-1 DLB (SUTURE) ×1
SUT STRATAFIX 14 PDO 48 VLT (SUTURE) ×1 IMPLANT
SUT STRATAFIX PDO 1 14 VIOLET (SUTURE) ×1
SUT VIC AB 0 CT1 36 (SUTURE) ×1 IMPLANT
SUT VIC AB 2-0 CT2 27 (SUTURE) ×2 IMPLANT
SUT VICRYL 1-0 27IN ABS (SUTURE) ×1
SUTURE STRATA SPIR 4-0 18 (SUTURE) ×1 IMPLANT
SUTURE VICRYL 1-0 27IN ABS (SUTURE) ×1 IMPLANT
SYR 30ML LL (SYRINGE) ×2 IMPLANT
TAPE CLOTH 3X10 WHT NS LF (GAUZE/BANDAGES/DRESSINGS) ×1 IMPLANT
TIP FAN IRRIG PULSAVAC PLUS (DISPOSABLE) ×1 IMPLANT
TOWEL OR 17X26 4PK STRL BLUE (TOWEL DISPOSABLE) IMPLANT
TRAP FLUID SMOKE EVACUATOR (MISCELLANEOUS) ×1 IMPLANT
WATER STERILE IRR 1000ML POUR (IV SOLUTION) ×1 IMPLANT
WRAPON POLAR PAD KNEE (MISCELLANEOUS) ×1

## 2023-11-10 NOTE — Evaluation (Signed)
Physical Therapy Evaluation Patient Details Name: Clarence Cook MRN: [id number] DOB: July 18, 1966 Today's Date: 11/10/2023  History of Present Illness  Patient is 58 year old male s/p R TKA. 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 PMH includes COPD, CPAP, HTN, DM, back sx, neck sx, wrist sx   Clinical Impression  Patient received in bed, he is alert, pleasant, ready for PT. Patient is independent with bed mobility. Transfers with cues and cga. Patient ambulated 25 feet with RW and cga. Cues for sequencing, safe use of AD, WBing. He reports no pain at this time. Patient will continue to benefit from skilled PT to improve safety and independence.         If plan is discharge home, recommend the following: A little help with walking and/or transfers;A little help with bathing/dressing/bathroom;Assist for transportation;Help with stairs or ramp for entrance   Can travel by private vehicle   yes     Equipment Recommendations None recommended by PT  Recommendations for Other Services       Functional Status Assessment Patient has had a recent decline in their functional status and demonstrates the ability to make significant improvements in function in a reasonable and predictable amount of time.     Precautions / Restrictions Precautions Precautions: Fall Restrictions Weight Bearing Restrictions Per Provider Order: Yes RLE Weight Bearing Per Provider Order: Weight bearing as tolerated      Mobility  Bed Mobility Overal bed mobility: Independent                  Transfers Overall transfer level: Needs assistance Equipment used: Rolling walker (2 wheels) Transfers: Sit to/from Stand Sit to Stand: Supervision                Ambulation/Gait Ambulation/Gait assistance: Contact guard assist Gait Distance (Feet): 25 Feet Assistive device: Rolling walker (2 wheels) Gait Pattern/deviations: Step-to pattern, Decreased step length - right, Decreased step length - left,  Decreased stride length Gait velocity: decr     General Gait Details: patient ambulated 25 feet with RW and cga. Cues for sequencing, WBing.  Stairs            Wheelchair Mobility     Tilt Bed    Modified Rankin (Stroke Patients Only)       Balance Overall balance assessment: Needs assistance Sitting-balance support: Feet supported Sitting balance-Leahy Scale: Normal     Standing balance support: Bilateral upper extremity supported, During functional activity, Reliant on assistive device for balance Standing balance-Leahy Scale: Fair                               Pertinent Vitals/Pain Pain Assessment Pain Assessment: No/denies pain    Home Living Family/patient expects to be discharged to:: Private residence Living Arrangements: Alone   Type of Home: House Home Access: Stairs to enter;Ramped entrance Entrance Stairs-Rails: Right     Home Layout: One level Home Equipment: Agricultural consultant (2 wheels);Cane - single point      Prior Function Prior Level of Function : Independent/Modified Independent;Driving             Mobility Comments: was using single crutch, has walker ADLs Comments: Takes showers at his mom's 2-3 x's/week (no shower at home, no running water)     Extremity/Trunk Assessment   Upper Extremity Assessment Upper Extremity Assessment: Defer to OT evaluation    Lower Extremity Assessment Lower Extremity Assessment: RLE deficits/detail RLE  Deficits / Details: s/p TKA, no pain at this time RLE Coordination: decreased gross motor    Cervical / Trunk Assessment Cervical / Trunk Assessment: Normal  Communication   Communication Communication: No apparent difficulties Cueing Techniques: Verbal cues  Cognition Arousal: Alert Behavior During Therapy: WFL for tasks assessed/performed Overall Cognitive Status: Within Functional Limits for tasks assessed                                          General  Comments      Exercises     Assessment/Plan    PT Assessment Patient needs continued PT services  PT Problem List Decreased strength;Decreased range of motion;Decreased activity tolerance;Decreased balance;Decreased mobility;Decreased safety awareness;Decreased knowledge of use of DME;Decreased knowledge of precautions       PT Treatment Interventions DME instruction;Gait training;Stair training;Functional mobility training;Therapeutic activities;Therapeutic exercise;Balance training;Neuromuscular re-education;Patient/family education    PT Goals (Current goals can be found in the Care Plan section)  Acute Rehab PT Goals Patient Stated Goal: to return home PT Goal Formulation: With patient Time For Goal Achievement: 11/18/23 Potential to Achieve Goals: Good    Frequency BID     Co-evaluation               AM-PAC PT "6 Clicks" Mobility  Outcome Measure Help needed turning from your back to your side while in a flat bed without using bedrails?: None Help needed moving from lying on your back to sitting on the side of a flat bed without using bedrails?: None Help needed moving to and from a bed to a chair (including a wheelchair)?: A Little Help needed standing up from a chair using your arms (e.g., wheelchair or bedside chair)?: A Little Help needed to walk in hospital room?: A Little Help needed climbing 3-5 steps with a railing? : A Little 6 Click Score: 20    End of Session Equipment Utilized During Treatment: Gait belt Activity Tolerance: Patient tolerated treatment well Patient left: in bed;with call bell/phone within reach;with SCD's reapplied Nurse Communication: Mobility status PT Visit Diagnosis: Other abnormalities of gait and mobility (R26.89);Muscle weakness (generalized) (M62.81);Difficulty in walking, not elsewhere classified (R26.2)    Time: 4098-1191 PT Time Calculation (min) (ACUTE ONLY): 13 min   Charges:   PT Evaluation $PT Eval Moderate  Complexity: 1 Mod   PT General Charges $$ ACUTE PT VISIT: 1 Visit         Eder Macek, PT, GCS 11/10/23,3:29 PM

## 2023-11-10 NOTE — Interval H&P Note (Signed)
Patient history and physical updated. Consent reviewed including risks, benefits, and alternatives to surgery. Patient agrees with above plan to proceed with right total knee arthroplasty  

## 2023-11-10 NOTE — Transfer of Care (Signed)
Immediate Anesthesia Transfer of Care Note  Patient: Clarence Cook  Procedure(s) Performed: TOTAL KNEE ARTHROPLASTY (Right: Knee)  Patient Location: PACU  Anesthesia Type:Spinal  Level of Consciousness: drowsy  Airway & Oxygen Therapy: Patient Spontanous Breathing and Patient connected to face mask oxygen  Post-op Assessment: Report given to RN  Post vital signs: stable  Last Vitals:  Vitals Value Taken Time  BP    Temp    Pulse 77 11/10/23 0940  Resp 16 11/10/23 0940  SpO2 98 % 11/10/23 0940  Vitals shown include unfiled device data.  Last Pain:  Vitals:   11/10/23 0613  TempSrc: Temporal  PainSc: 6          Complications: No notable events documented.

## 2023-11-10 NOTE — Progress Notes (Signed)
Pt was advised by staff to not bend his knee and pt said he was going to bend it anyway. Pt said he has done this before and he was bending andf also made the bed bend upwards.

## 2023-11-10 NOTE — H&P (Signed)
HPI:  Clarence Cook is a 58 y.o. male who presents for history and physical for right total knee arthroplasty with Dr. Audelia Acton on 11/10/2023. Patient has advanced right knee osteoarthritis with severe varus deformity and degenerative changes throughout the medial and lateral compartment. Patient's pain is moderate to severe. He has experienced no relief with conservative treatment. This pain is interfering with quality of life and activities daily living. His knee will buckle and give way. Patient's mobility is limited due to his right knee pain. He has had a successful left total knee arthroplasty nearly 3 months ago.  Current Outpatient Medications  Medication Sig Dispense Refill  ARIPiprazole (ABILIFY) 10 MG tablet Take 10 mg by mouth once daily  gabapentin (NEURONTIN) 300 MG capsule 300 mg 3 (three) times daily  glipiZIDE (GLUCOTROL) 5 MG tablet Take 5 mg by mouth every morning  lisinopriL (ZESTRIL) 10 MG tablet Take 1 tablet by mouth once daily  meloxicam (MOBIC) 15 MG tablet Take 15 mg by mouth once daily  metFORMIN (GLUCOPHAGE-XR) 500 MG XR tablet Take 1,500 mg by mouth once daily  naltrexone (REVIA) 50 mg tablet Take 50 mg by mouth once daily  topiramate (TOPAMAX) 25 MG tablet Take 25 mg by mouth 2 (two) times daily  traMADoL (ULTRAM) 50 mg tablet Take 1 tablet (50 mg total) by mouth every 6 (six) hours as needed for Pain 30 tablet 0  traZODone (DESYREL) 100 MG tablet Take 100 mg by mouth at bedtime   No current facility-administered medications for this visit.   No Known Allergies Past Medical History:  Diagnosis Date  Brain injury (CMS/HHS-HCC)  Diabetes mellitus without complication (CMS/HHS-HCC)  Hypertension   Past Surgical History:  Procedure Laterality Date  ARTHROPLASTY TOTAL KNEE Left 08/08/2023  By Dr. Maple Hudson SURGERY  FRACTURE SURGERY Left  left leg  neck surgery  wrist surgery Right   Family History  Problem Relation Name Age of Onset  High blood  pressure (Hypertension) Mother  Diabetes type II Mother  Heart disease Father  Diabetes type II Father   Social History   Socioeconomic History  Marital status: Divorced  Number of children: 2  Years of education: GED  Occupational History  Occupation: disabled- Holiday representative  Tobacco Use  Smoking status: Former  Types: Cigarettes  Smokeless tobacco: Current  Types: Engineer, drilling  Vaping status: Every Day  Substance and Sexual Activity  Alcohol use: Not Currently  Comment: history of alcohol abuse  Drug use: Yes  Types: "Crack" cocaine  Sexual activity: Defer  Partners: Female   Social Drivers of Health   Food Insecurity: Food Insecurity Present (08/08/2023)  Received from [facility]  Hunger Vital Sign  Worried About Running Out of Food in the Last Year: Sometimes true  Ran Out of Food in the Last Year: Sometimes true  Transportation Needs: No Transportation Needs (08/08/2023)  Received from [facility] - Transportation  Lack of Transportation (Medical): No  Lack of Transportation (Non-Medical): No   Review of Systems:  A comprehensive 14 point ROS was performed, reviewed, and the pertinent orthopaedic findings are documented in the HPI.  Exam: Vitals:  10/26/23 1120  BP: 118/64  Weight: 76.7 kg (169 lb 3.2 oz)  Height: 175.3 cm (5\' 9" )  PainSc: 7  PainLoc: Knee   General:  Well developed, well nourished, no apparent distress, normal affect, antalgic gait with no assistive device  HEENT: Head normocephalic, atraumatic, PERRL.   Abdomen: Soft, non tender, non distended,  Bowel sounds present.  Heart: Examination of the heart reveals regular, rate, and rhythm. There is no murmur noted on ascultation. There is a normal apical pulse.  Lungs: Lungs are clear to auscultation. There is no wheeze, rhonchi, or crackles. There is normal expansion of bilateral chest walls.   Comprehensive Knee Exam: Gait Antalgic on right with varus thrust on the  right  Alignment Neutral   Inspection Right  Skin Normal appearance with no obvious deformity. No ecchymosis or erythema.  Soft Tissue No focal soft tissue swelling  Quad Atrophy None   Palpation   Range of Motion Right  Flexion 5-110  Extension -5, without hyperextension   Ligamentous Exam Right  Lachman Normal  Valgus 0 Normal  Valgus 30 Normal  Varus 0 Normal  Varus 30 Normal  Anterior Drawer Normal  Posterior Drawer Normal   Meniscal Exam Right  Hyperflexion Test Positive  Hyperextension Test Positive  McMurray's Positive   Neurovascular Right  Quadriceps Strength 5/5  Hamstring Strength 5/5  Hip Abductor Strength 4/5  Distal Motor Normal  Distal Sensory Normal light touch sensation  Distal Pulses Normal    X-rays/MRI/Lab data:  X-rays of left knee reviewed by me today from 06/22/2023 show complete loss of joint space in the medial compartment of the right knee with varus deformity. Spurring along the medial tibial plateau. Large superior pole patella osteophytes present. Advanced patellofemoral osteoarthritis.  Assessment: Encounter Diagnoses  Name Primary?  Primary osteoarthritis of right knee Yes  Type 2 diabetes mellitus treated without insulin (CMS/HHS-HCC)  Right knee osteoarthritis  Plan: 58 year old presents for history and physical for right total knee arthroplasty 11/10/2023 by Dr. Audelia Acton. Pain is moderate to severe squat of life and activities daily living. He has had no relief with conservative treatment. X-rays show advanced degenerative changes throughout the left right knee with complete loss of joint space. Risks, benefits, complications of a right total knee arthroplasty have been discussed with the patient. Patient has agreed and consented for the procedure with Dr. Audelia Acton on 11/10/2023.  The hospitalization and post-operative care and rehabilitation were also discussed. The use of perioperative antibiotics and DVT prophylaxis were discussed.  The risk, benefits and alternatives to a surgical intervention were discussed at length with the patient. The patient was also advised of risks related to the medical comorbidities and elevated body mass index (BMI). A lengthy discussion took place to review the most common complications including but not limited to: stiffness, loss of function, complex regional pain syndrome, deep vein thrombosis, pulmonary embolus, heart attack, stroke, infection, wound breakdown, numbness, intraoperative fracture, damage to nerves, tendon,muscles, arteries or other blood vessels, death and other possible complications from anesthesia. The patient was told that we will take steps to minimize these risks by using sterile technique, antibiotics and DVT prophylaxis when appropriate and follow the patient postoperatively in the office setting to monitor progress. The possibility of recurrent pain, no improvement in pain and actual worsening of pain were also discussed with the patient.   All questions answered patient agrees with above plan for right total knee arthroplasty.

## 2023-11-10 NOTE — Op Note (Signed)
Patient Name: Rajan Leser  FAO:[id number]  Pre-Operative Diagnosis: Right knee Osteoarthritis  Post-Operative Diagnosis: (same)  Procedure: Right Total Knee Arthroplasty  Components/Implants: Femur: Persona Size 7 CR PPS   Tibia: Persona Size F OsseoTi Keeled  Poly: 10mm MC  Patella: 32x37mm symmetric OsseoTi   Femoral Valgus Cut Angle: 5 degrees  Distal Femoral Re-cut: none  Patella Resurfacing: yes   Date of Surgery: 11/10/2023  Surgeon: Reinaldo Berber MD  Assistant: Amador Cunas PA (present and scrubbed throughout the case, critical for assistance with exposure, retraction, instrumentation, and closure)   Anesthesiologist: Laural Benes  Anesthesia: Spinal   Tourniquet Time: 50 min  EBL: 50cc  IVF: 600cc  Complications: None   Brief history: The patient is a 58 year old male with a history of osteoarthritis of the right knee with pain limiting their range of motion and activities of daily living, which has failed multiple attempts at conservative therapy.  The risks and benefits of total knee arthroplasty as definitive surgical treatment were discussed with the patient, who opted to proceed with the operation.  After outpatient medical clearance and optimization was completed the patient was admitted to [facility] for the procedure.  All preoperative films were reviewed and an appropriate surgical plan was made prior to surgery. Preoperative range of motion was 5 to 110 with a 5 degree flexion contracture. The patient was identified as having a varus alignment.   Description of procedure: The patient was brought to the operating room where laterality was confirmed by all those present to be the right side.   Spinal anesthesia was administered and the patient received an intravenous dose of antibiotics for surgical prophylaxis and a dose of tranexamic acid.  Patient is positioned supine on the operating room table with all bony prominences well-padded.  A  well-padded tourniquet was applied to the right thigh.  The knee was then prepped and draped in usual sterile fashion with multiple layers of adhesive and nonadhesive drapes.  All of those present in the operating room participated in a surgical timeout laterality and patient were confirmed.   An Esmarch was wrapped around the extremity and the leg was elevated and the knee flexed.  The tourniquet was inflated to a pressure of 250 mmHg. The Esmarch was removed and the leg was brought down to full extension.  The patella and tibial tubercle identified and outlined using a marking pen and a midline skin incision was made with a knife carried through the subcutaneous tissue down to the extensor retinaculum.  After exposure of the extensor mechanism the medial parapatellar arthrotomy was performed with a scalpel and electrocautery extending down medial and distal to the tibial tubercle taking care to avoid incising the patellar tendon.   A standard medial release was performed over the proximal tibia.  The knee was brought into extension in order to excise the fat pad taking care not to damage the patella tendon.  The superior soft tissue was removed from the anterior surface of the distal femur to visualize for the procedure.  The knee was then brought into flexion with the patella subluxed laterally and subluxing the tibia anteriorly.  The ACL was transected and removed with electrocautery and additional soft tissue was removed from the proximal surface of the tibia to fully expose. The PCL was found to be intact and was preserved.  An extramedullary tibial cutting guide was then applied to the leg with a spring-loaded ankle clamp placed around the distal tibia just above the malleoli the  angulation of the guide was adjusted to give some posterior slope in the tibial resection with an appropriate varus/valgus alignment.  The resection guide was then pinned to the proximal tibia and the proximal tibial surface was  resected with an oscillating saw.  Careful attention was paid to ensure the blade did not disrupt any of the soft tissues including any lateral or medial ligament.  Attention was then turned to the femur, with the knee slightly flexed a opening drill was used to enter the medullary canal of the femur.  After removing the drill marrow was suctioned out to decompress the distal femur.  An intramedullary femoral guide was then inserted into the drill hole and the alignment guide was seated firmly against the distal end of the medial femoral condyle.  The distal femoral cutting guide was then attached and pinned securely to the anterior surface of the femur and the intramedullary rod and alignment guide was removed.  Distal femur resection was then performed with an oscillating saw with retractors protecting medial and laterally.   The distal cutting block was then removed and the extension gap was checked with a spacer.  Extension gap was found to be appropriately sized to accommodate the spacer block.   The femoral sizing guide was then placed securely into the posterior condyles of the femur and the femoral size was measured and determined to be 7.  The size 7; 4-in-1 cutting guide was placed in position and secured with 2 pins.  The anterior posterior and chamfer resections were then performed with an oscillating saw.  Bony fragments and osteophytes were then removed.  Using a lamina spreader the posterior medial and lateral condyles were checked for additional osteophytes and posterior soft tissue remnants.  Any remaining meniscus was removed at this time.  Periarticular injection was performed in the meniscal rims and posterior capsule with aspiration performed to ensure no intravascular injection.   The tibia was then exposed and the tibial trial was pinned onto the plateau after confirming appropriate orientation and rotation.  Using the drill bushing the tibia was prepared to the appropriate drill  depth.  Tibial broach impactor was then driven through the punch guide using a mallet.  The femoral trial component was then inserted onto the femur. A trial tibial polyethylene bearing was then placed and the knee was reduced.  The knee achieved full extension with no hyperextension and was found to be balanced in flexion and extension with the trials in place.  The knee was then brought into full extension the patella was everted and held with 2 Kocher clamps.  The articular surface of the patella was then resected with an patella reamer and saw after careful measurement with a caliper.  The patella was then prepared with the drill guide and a trial patella was placed.  The knee was then taken through range of motion and it was found that the patella articulated appropriately with the trochlea and good patellofemoral motion without subluxation.    The correct final components for implantation were confirmed and opened by the circulator nurse.  The knee was irrigated with normal saline via pulsatile lavage to remove any bony debris or soft tissue.  The prepared surface of the tibia was exposed and the tibial component was implanted with good bony contact.  The femoral component was then placed and impacted showing good coverage and a good snug fit.  The patella was then cleared off and the patella compression tool was used to apply the  patellar component with symmetric compression onto the patella.  The tibial component was then irrigated and cleared of any debris and a real polyethylene component was placed and engaged with the locking mechanism.  The knee was then injected with the particular cocktail.  The knee was taken through range of motion and found to be stable in flexion and extension with patellar tracking.  The knee was then irrigated with copious amount of normal saline via pulsatile lavage to remove all loose bodies and other debris.  The knee was then irrigated with surgiphor betadine based wash  and reirrigated with saline.  The tourniquet was then dropped and all bleeding vessels were identified and coagulated.  The arthrotomy was approximated with #1 Vicryl and closed with #1 Stratafix suture.  The knee was brought into slight flexion and the subcutaneous tissues were closed with 0 Vicryl, 2-0 Vicryl and a running subcuticular 4-0 stratafix barbed suture.  Skin was then glued with Dermabond.  A sterile adhesive dressing was then placed along with a sequential compression device to the calf, a Ted stocking, and a cryotherapy cuff.   Sponge, needle, and Lap counts were all correct at the end of the case.   The patient was transferred off of the operating room table to a hospital bed, good pulses were found distally on the operative side.  The patient was transferred to the recovery room in stable condition.

## 2023-11-10 NOTE — Anesthesia Procedure Notes (Signed)
Spinal  Patient location during procedure: OR Start time: 11/10/2023 7:32 AM Reason for block: surgical anesthesia Staffing Performed: resident/CRNA  Performed by: Jaye Beagle, CRNA Authorized by: Foye Deer, MD   Preanesthetic Checklist Completed: patient identified, IV checked, site marked, risks and benefits discussed, surgical consent, monitors and equipment checked, pre-op evaluation and timeout performed Spinal Block Patient position: sitting Prep: DuraPrep Patient monitoring: heart rate, cardiac monitor, continuous pulse ox and blood pressure Approach: midline Location: L3-4 Injection technique: single-shot Needle Needle type: Sprotte  Needle gauge: 24 G Needle length: 9 cm Assessment Sensory level: T4 Events: CSF return Additional Notes Pt tolerated well.  Atraumatic attempt x1.  No pain with injection, negative heme, negative paresthesia.  Good free flow CSF pre/post injection.

## 2023-11-10 NOTE — Plan of Care (Signed)
  Problem: Activity: Goal: Ability to avoid complications of mobility impairment will improve Outcome: Progressing   Problem: Pain Management: Goal: Pain level will decrease with appropriate interventions Outcome: Progressing   Problem: Skin Integrity: Goal: Will show signs of wound healing Outcome: Progressing   

## 2023-11-10 NOTE — Progress Notes (Signed)
Patient awake/alert x4, moving bilateral lower ext, sensation intact, PT made aware, Pulses +2. Lunch ordered for patient, ate 20% then stated "this is garbage"  Also states his family will bring him food.  Offered to order another meal but patient declined.  Denies pain. Tolerating po fluids without event.

## 2023-11-11 ENCOUNTER — Encounter: Payer: Self-pay | Admitting: Orthopedic Surgery

## 2023-11-11 DIAGNOSIS — M1711 Unilateral primary osteoarthritis, right knee: Secondary | ICD-10-CM | POA: Diagnosis not present

## 2023-11-11 LAB — CBC
HCT: 39.2 % (ref 39.0–52.0)
Hemoglobin: 13.5 g/dL (ref 13.0–17.0)
MCH: 30 pg (ref 26.0–34.0)
MCHC: 34.4 g/dL (ref 30.0–36.0)
MCV: 87.1 fL (ref 80.0–100.0)
Platelets: 161 10*3/uL (ref 150–400)
RBC: 4.5 MIL/uL (ref 4.22–5.81)
RDW: 13.3 % (ref 11.5–15.5)
WBC: 12.3 10*3/uL — ABNORMAL HIGH (ref 4.0–10.5)
nRBC: 0 % (ref 0.0–0.2)

## 2023-11-11 LAB — BASIC METABOLIC PANEL
Anion gap: 8 (ref 5–15)
BUN: 17 mg/dL (ref 6–20)
CO2: 23 mmol/L (ref 22–32)
Calcium: 8.6 mg/dL — ABNORMAL LOW (ref 8.9–10.3)
Chloride: 104 mmol/L (ref 98–111)
Creatinine, Ser: 0.57 mg/dL — ABNORMAL LOW (ref 0.61–1.24)
GFR, Estimated: >60 mL/min (ref >60)
Glucose, Bld: 162 mg/dL — ABNORMAL HIGH (ref 70–99)
Potassium: 4.1 mmol/L (ref 3.5–5.1)
Sodium: 135 mmol/L (ref 135–145)

## 2023-11-11 LAB — GLUCOSE, CAPILLARY: Glucose-Capillary: 146 mg/dL — ABNORMAL HIGH (ref 70–99)

## 2023-11-11 MED ORDER — KETOROLAC TROMETHAMINE 15 MG/ML IJ SOLN
INTRAMUSCULAR | Status: AC
  Filled 2023-11-11: qty 1

## 2023-11-11 MED ORDER — INSULIN ASPART 100 UNIT/ML IJ SOLN
INTRAMUSCULAR | Status: AC
  Filled 2023-11-11: qty 1

## 2023-11-11 MED ORDER — CELECOXIB 200 MG PO CAPS: 200.0000 mg | ORAL_CAPSULE | Freq: Two times a day (BID) | ORAL | 0 refills | Status: AC

## 2023-11-11 MED ORDER — DOCUSATE SODIUM 100 MG PO CAPS: 100.0000 mg | ORAL_CAPSULE | Freq: Two times a day (BID) | ORAL | 0 refills | Status: DC

## 2023-11-11 MED ORDER — TRAMADOL HCL 50 MG PO TABS: 50.0000 mg | ORAL_TABLET | Freq: Four times a day (QID) | ORAL | 0 refills | Status: DC | PRN

## 2023-11-11 MED ORDER — HYDROCODONE-ACETAMINOPHEN 5-325 MG PO TABS
ORAL_TABLET | ORAL | Status: AC
  Filled 2023-11-11: qty 2

## 2023-11-11 MED ORDER — ONDANSETRON HCL 4 MG PO TABS: 4.0000 mg | ORAL_TABLET | Freq: Four times a day (QID) | ORAL | 0 refills | Status: DC | PRN

## 2023-11-11 MED ORDER — ENOXAPARIN SODIUM 30 MG/0.3ML IJ SOSY
PREFILLED_SYRINGE | INTRAMUSCULAR | Status: AC
  Filled 2023-11-11: qty 0.3

## 2023-11-11 MED ORDER — ACETAMINOPHEN 500 MG PO TABS: 1000.0000 mg | ORAL_TABLET | Freq: Three times a day (TID) | ORAL | 0 refills | Status: AC

## 2023-11-11 MED ORDER — OXYCODONE HCL 5 MG PO TABS: 2.5000 mg | ORAL_TABLET | Freq: Three times a day (TID) | ORAL | 0 refills | Status: DC | PRN

## 2023-11-11 MED ORDER — GABAPENTIN 300 MG PO CAPS
ORAL_CAPSULE | ORAL | Status: AC
  Filled 2023-11-11: qty 1

## 2023-11-11 MED ORDER — ENOXAPARIN SODIUM 40 MG/0.4ML IJ SOSY: 40.0000 mg | PREFILLED_SYRINGE | INTRAMUSCULAR | 0 refills | Status: DC

## 2023-11-11 MED ORDER — DOCUSATE SODIUM 100 MG PO CAPS
ORAL_CAPSULE | ORAL | Status: AC
  Filled 2023-11-11: qty 1

## 2023-11-11 MED ORDER — ACETAMINOPHEN 500 MG PO TABS
ORAL_TABLET | ORAL | Status: AC
  Filled 2023-11-11: qty 2

## 2023-11-11 MED ORDER — PANTOPRAZOLE SODIUM 40 MG PO TBEC
DELAYED_RELEASE_TABLET | ORAL | Status: AC
  Filled 2023-11-11: qty 1

## 2023-11-11 MED ORDER — TRAMADOL HCL 50 MG PO TABS
ORAL_TABLET | ORAL | Status: AC
  Filled 2023-11-11: qty 1

## 2023-11-11 MED ORDER — LISINOPRIL 5 MG PO TABS
ORAL_TABLET | ORAL | Status: AC
  Filled 2023-11-11: qty 2

## 2023-11-11 NOTE — Progress Notes (Signed)
Physical Therapy Treatment Patient Details Name: Clarence Cook MRN: [id number] DOB: 02-Aug-1966 Today's Date: 11/11/2023   History of Present Illness Patient is 58 year old male s/p R TKA. 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 PMH includes COPD, CPAP, HTN, DM, back sx, neck sx, wrist sx    PT Comments  Patient received in bed, just finished breakfast and received pain med. He reports 5-6/10 pain with mobility. Patient is mod I with bed mobility and transfers. Ambulated 200 feet with RW and cues for sequencing, safety. Patient progressing very well. He will continue to benefit from skilled PT to improve mobility, safety and strength.     If plan is discharge home, recommend the following: A little help with walking and/or transfers;A little help with bathing/dressing/bathroom;Assist for transportation;Help with stairs or ramp for entrance   Can travel by private vehicle      yes  Equipment Recommendations  Rolling walker (2 wheels)    Recommendations for Other Services       Precautions / Restrictions Precautions Precautions: Fall Restrictions Weight Bearing Restrictions Per Provider Order: Yes RLE Weight Bearing Per Provider Order: Weight bearing as tolerated     Mobility  Bed Mobility Overal bed mobility: Independent                  Transfers Overall transfer level: Needs assistance Equipment used: Rolling walker (2 wheels) Transfers: Sit to/from Stand Sit to Stand: Supervision                Ambulation/Gait Ambulation/Gait assistance: Contact guard assist Gait Distance (Feet): 200 Feet Assistive device: Rolling walker (2 wheels) Gait Pattern/deviations: Step-to pattern, Decreased step length - right, Decreased step length - left, Decreased stride length, Decreased stance time - right Gait velocity: decr     General Gait Details: Progressing well with ambulation distance. Supervision/Cga assist. Cues needed for sequencing   Stairs              Wheelchair Mobility     Tilt Bed    Modified Rankin (Stroke Patients Only)       Balance Overall balance assessment: Modified Independent Sitting-balance support: Feet supported Sitting balance-Leahy Scale: Normal     Standing balance support: Bilateral upper extremity supported, During functional activity, Reliant on assistive device for balance Standing balance-Leahy Scale: Good                              Cognition Arousal: Alert Behavior During Therapy: WFL for tasks assessed/performed Overall Cognitive Status: Within Functional Limits for tasks assessed                                          Exercises Other Exercises Other Exercises: Patient is familiar with HEP as he had other knee done 3 months ago. Handout provided.    General Comments        Pertinent Vitals/Pain Pain Assessment Pain Assessment: 0-10 Pain Score: 5  Pain Location: R knee Pain Descriptors / Indicators: Discomfort, Sore Pain Intervention(s): Monitored during session, Ice applied, Repositioned    Home Living                          Prior Function            PT Goals (current goals can now  be found in the care plan section) Acute Rehab PT Goals Patient Stated Goal: to return home PT Goal Formulation: With patient Time For Goal Achievement: 11/18/23 Potential to Achieve Goals: Good Progress towards PT goals: Progressing toward goals    Frequency    BID      PT Plan      Co-evaluation              AM-PAC PT "6 Clicks" Mobility   Outcome Measure    Help needed moving from lying on your back to sitting on the side of a flat bed without using bedrails?: None Help needed moving to and from a bed to a chair (including a wheelchair)?: A Little Help needed standing up from a chair using your arms (e.g., wheelchair or bedside chair)?: None Help needed to walk in hospital room?: A Little Help needed climbing 3-5 steps with a  railing? : A Little 6 Click Score: 17    End of Session Equipment Utilized During Treatment: Gait belt   Patient left: in bed;with call bell/phone within reach;with family/visitor present Nurse Communication: Mobility status PT Visit Diagnosis: Muscle weakness (generalized) (M62.81);Difficulty in walking, not elsewhere classified (R26.2);Pain Pain - Right/Left: Right Pain - part of body: Knee     Time: 6213-0865 PT Time Calculation (min) (ACUTE ONLY): 11 min  Charges:    $Gait Training: 8-22 mins PT General Charges $$ ACUTE PT VISIT: 1 Visit                     Jane Birkel, PT, GCS 11/11/23,9:44 AM

## 2023-11-11 NOTE — Progress Notes (Signed)
   Subjective: 1 Day Post-Op Procedure(s) (LRB): TOTAL KNEE ARTHROPLASTY (Right) Patient reports pain as mild.   Patient is well, and has had no acute complaints or problems Denies any CP, SOB, ABD pain. We will continue therapy today.  Plan is to go Home after hospital stay.  Objective: Vital signs in last 24 hours: Temp:  [97.4 F (36.3 C)-99 F (37.2 C)] 99 F (37.2 C) (01/24 0749) Pulse Rate:  [65-105] 71 (01/24 0749) Resp:  [12-21] 18 (01/24 0403) BP: (75-126)/(51-77) 115/74 (01/24 0749) SpO2:  [95 %-99 %] 99 % (01/24 0749)  Intake/Output from previous day: 01/23 0701 - 01/24 0700 In: 1461 [P.O.:461; I.V.:900; IV Piggyback:100] Out: 2350 [Urine:2300; Blood:50] Intake/Output this shift: No intake/output data recorded.  Recent Labs    11/11/23 0607  HGB 13.5   Recent Labs    11/11/23 0607  WBC 12.3*  RBC 4.50  HCT 39.2  PLT 161   Recent Labs    11/11/23 0607  NA 135  K 4.1  CL 104  CO2 23  BUN 17  CREATININE 0.57*  GLUCOSE 162*  CALCIUM 8.6*   No results for input(s): "LABPT", "INR" in the last 72 hours.  EXAM General - Patient is Alert, Appropriate, and Oriented Extremity - Neurovascular intact Sensation intact distally Intact pulses distally Dorsiflexion/Plantar flexion intact No cellulitis present Compartment soft Dressing - dressing C/D/I and no drainage Motor Function - intact, moving foot and toes well on exam.   Past Medical History:  Diagnosis Date   Arthritis    Brain injury (HCC) 1989   Cyst of nasopharynx    will have it bipsed in the near future   Diabetes [facility])    Family history of adverse reaction to anesthesia    mother had terrible time waking up   Hypertension     Assessment/Plan:   1 Day Post-Op Procedure(s) (LRB): TOTAL KNEE ARTHROPLASTY (Right) Principal Problem:   S/P total knee arthroplasty, right  Estimated body mass index is 24.91 kg/m as calculated from the following:   Height as of this encounter: 5\' 9"   (1.753 m).   Weight as of this encounter: 76.5 kg. Advance diet Up with therapy Pain well controlled Labs and VSS CM to assist with discharge to home with HHPT tomorrow  DVT Prophylaxis - Lovenox, TED hose, and SCDs Weight-Bearing as tolerated to right leg   T. Cranston Neighbor, PA-C [facility] 11/11/2023, 8:11 AM

## 2023-11-11 NOTE — TOC Initial Note (Signed)
Transition of Care [facility]) - Initial/Assessment Note    Patient Details  Name: Clarence Cook MRN: [id number] Date of Birth: May 15, 1966  Transition of Care [facility]) CM/SW Contact:    Marlowe Sax, RN Phone Number: 11/11/2023, 9:26 AM  Clinical Narrative:                 The patient is scheduled for Outpatient PT, they report that they have Rolling Walker (2 wheels);Cane - single point    At home        Patient Goals and CMS Choice            Expected Discharge Plan and Services                                              Prior Living Arrangements/Services                       Activities of Daily Living   ADL Screening (condition at time of admission) Independently performs ADLs?: Yes (appropriate for developmental age) Is the patient deaf or have difficulty hearing?: Yes Does the patient have difficulty seeing, even when wearing glasses/contacts?: Yes Does the patient have difficulty concentrating, remembering, or making decisions?: No  Permission Sought/Granted                  Emotional Assessment              Admission diagnosis:  S/P total knee arthroplasty, right [Z96.651] Patient Active Problem List   Diagnosis Date Noted   S/P total knee arthroplasty, right 11/10/2023   S/P TKR (total knee replacement), left 08/08/2023   PCP:  Pcp, No Pharmacy:   [facility] [phone] Hassell Halim [address] [place] [address] Phone: [phone] Fax: [phone]     Social Drivers of Health (SDOH) Social History: SDOH Screenings   Food Insecurity: Food Insecurity Present (11/10/2023)  Housing: High Risk (11/10/2023)  Transportation Needs: No Transportation Needs (11/10/2023)  Utilities: Not At Risk (11/10/2023)  Social Connections: Socially Isolated (11/10/2023)  Tobacco Use: Medium Risk (11/10/2023)   SDOH Interventions:     Readmission Risk Interventions     No data to display

## 2023-11-11 NOTE — Anesthesia Postprocedure Evaluation (Signed)
Anesthesia Post Note  Patient: Clarence Cook  Procedure(s) Performed: TOTAL KNEE ARTHROPLASTY (Right: Knee)  Patient location during evaluation: Nursing Unit Anesthesia Type: Spinal Level of consciousness: oriented and awake and alert Pain management: pain level controlled Vital Signs Assessment: post-procedure vital signs reviewed and stable Respiratory status: spontaneous breathing and respiratory function stable Cardiovascular status: blood pressure returned to baseline and stable Postop Assessment: no headache, no backache, no apparent nausea or vomiting and patient able to bend at knees Anesthetic complications: no   No notable events documented.   Last Vitals:  Vitals:   11/11/23 0403 11/11/23 0749  BP: 101/70 115/74  Pulse: 84 71  Resp: 18   Temp:  37.2 C  SpO2: 98% 99%    Last Pain:  Vitals:   11/11/23 0723  TempSrc:   PainSc: 6                  Jasmyne Lodato Lawerance Cruel

## 2023-11-11 NOTE — Progress Notes (Signed)
Physical Therapy Treatment Patient Details Name: Clarence Cook MRN: [id number] DOB: February 12, 1966 Today's Date: 11/11/2023   History of Present Illness Patient is 58 year old male s/p R TKA. 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 PMH includes COPD, CPAP, HTN, DM, back sx, neck sx, wrist sx    PT Comments  Patient received sitting edge of bed dressed in street clothes. He agrees to PT session. Patient ambulating well, progressed distance to 300 feet with RW and ambulated up/down 4 steps x 2 reps with single rail supervision. Patient without significant difficulty noted or reported. He is ready to go home. RN notified.      If plan is discharge home, recommend the following: Help with stairs or ramp for entrance;Assistance with cooking/housework   Can travel by private vehicle      yes  Equipment Recommendations  Rolling walker (2 wheels)    Recommendations for Other Services       Precautions / Restrictions Precautions Precautions: Fall Restrictions Weight Bearing Restrictions Per Provider Order: Yes RLE Weight Bearing Per Provider Order: Weight bearing as tolerated     Mobility  Bed Mobility Overal bed mobility: Independent                  Transfers Overall transfer level: Modified independent Equipment used: Rolling walker (2 wheels) Transfers: Sit to/from Stand Sit to Stand: Modified independent (Device/Increase time)                Ambulation/Gait Ambulation/Gait assistance: Modified independent (Device/Increase time) Gait Distance (Feet): 300 Feet Assistive device: Rolling walker (2 wheels), 1 person hand held assist Gait Pattern/deviations: Step-through pattern Gait velocity: WFL     General Gait Details: Patient with improved ambulation pace, distance this session. Ambulated ~10 feet with single HHA. Did well. He is walking short distances in room without AD.   Stairs Stairs: Yes Stairs assistance: Supervision Stair Management: One rail Left, Step  to pattern Number of Stairs: 4 General stair comments: patient is safe on steps using one rail   Wheelchair Mobility     Tilt Bed    Modified Rankin (Stroke Patients Only)       Balance Overall balance assessment: Modified Independent Sitting-balance support: Feet supported Sitting balance-Leahy Scale: Normal     Standing balance support: Bilateral upper extremity supported, During functional activity, Reliant on assistive device for balance Standing balance-Leahy Scale: Good                              Cognition Arousal: Alert Behavior During Therapy: WFL for tasks assessed/performed Overall Cognitive Status: Within Functional Limits for tasks assessed                                          Exercises Other Exercises Other Exercises: Patient is familiar with HEP as he had other knee done 3 months ago. Handout provided.    General Comments        Pertinent Vitals/Pain Pain Assessment Pain Assessment: 0-10 Pain Score: 5  Pain Location: R knee Pain Descriptors / Indicators: Discomfort, Sore Pain Intervention(s): Monitored during session, Repositioned, Ice applied    Home Living                          Prior Function  PT Goals (current goals can now be found in the care plan section) Acute Rehab PT Goals Patient Stated Goal: to return home PT Goal Formulation: With patient Time For Goal Achievement: 11/18/23 Potential to Achieve Goals: Good Progress towards PT goals: Progressing toward goals    Frequency    BID      PT Plan      Co-evaluation              AM-PAC PT "6 Clicks" Mobility   Outcome Measure  Help needed turning from your back to your side while in a flat bed without using bedrails?: None Help needed moving from lying on your back to sitting on the side of a flat bed without using bedrails?: None Help needed moving to and from a bed to a chair (including a wheelchair)?:  None Help needed standing up from a chair using your arms (e.g., wheelchair or bedside chair)?: None Help needed to walk in hospital room?: A Little Help needed climbing 3-5 steps with a railing? : A Little 6 Click Score: 22    End of Session Equipment Utilized During Treatment: Gait belt Activity Tolerance: Patient tolerated treatment well Patient left: in bed;Other (comment) (seated on side of bed) Nurse Communication: Mobility status PT Visit Diagnosis: Difficulty in walking, not elsewhere classified (R26.2);Pain Pain - Right/Left: Right Pain - part of body: Knee     Time: 1230-1241 PT Time Calculation (min) (ACUTE ONLY): 11 min  Charges:    $Gait Training: 8-22 mins PT General Charges $$ ACUTE PT VISIT: 1 Visit                     Rosa Wyly, PT, GCS 11/11/23,12:52 PM

## 2023-11-11 NOTE — Discharge Summary (Cosign Needed)
Physician Discharge Summary  Patient ID: Clarence Cook MRN: [id number] DOB/AGE: 02/24/1966 58 y.o.  Admit date: 11/10/2023 Discharge date: 11/12/2023  Admission Diagnoses:  S/P total knee arthroplasty, right [Z96.651]   Discharge Diagnoses: Patient Active Problem List   Diagnosis Date Noted   S/P total knee arthroplasty, right 11/10/2023   S/P TKR (total knee replacement), left 08/08/2023    Past Medical History:  Diagnosis Date   Arthritis    Brain injury (HCC) 1989   Cyst of nasopharynx    will have it bipsed in the near future   Diabetes [facility])    Family history of adverse reaction to anesthesia    mother had terrible time waking up   Hypertension      Transfusion: none   Consultants (if any):   Discharged Condition: Improved  Hospital Course: Clarence Cook is an 58 y.o. male who was admitted 11/10/2023 with a diagnosis of S/P total knee arthroplasty, right and went to the operating room on 11/10/2023 and underwent the above named procedures.    Surgeries: Procedure(s): TOTAL KNEE ARTHROPLASTY on 11/10/2023 Patient tolerated the surgery well. Taken to PACU where she was stabilized and then transferred to the orthopedic floor.  Started on Lovenox 30 mg q 12 hrs. TEDs and SCDs applied bilaterally. Heels elevated on bed. No evidence of DVT. Negative Homan. Physical therapy started on day #1 for gait training and transfer. OT started day #1 for ADL and assisted devices.  Patient's IV was d/c on day #1. Patient was able to safely and independently complete all PT goals. PT recommending discharge to home.    On post op day #2 patient was stable and ready for discharge to home with HHPT.  Implants: Femur: Persona Size 7 CR PPS   Tibia: Persona Size F OsseoTi Keeled  Poly: 10mm MC  Patella: 32x76mm symmetric OsseoTi     He was given perioperative antibiotics:  Anti-infectives (From admission, onward)    Start     Dose/Rate Route Frequency Ordered Stop   11/10/23 1500   ceFAZolin (ANCEF) IVPB 2g/100 mL premix        2 g 200 mL/hr over 30 Minutes Intravenous Every 6 hours 11/10/23 1432 11/10/23 2054   11/10/23 0615  ceFAZolin (ANCEF) IVPB 2g/100 mL premix        2 g 200 mL/hr over 30 Minutes Intravenous On call to O.R. 11/10/23 4098 11/10/23 0743     .  He was given sequential compression devices, early ambulation, and Lovenox TEDs for DVT prophylaxis.  He benefited maximally from the hospital stay and there were no complications.    Recent vital signs:  Vitals:   11/11/23 0403 11/11/23 0749  BP: 101/70 115/74  Pulse: 84 71  Resp: 18   Temp:  99 F (37.2 C)  SpO2: 98% 99%    Recent laboratory studies:  Lab Results  Component Value Date   HGB 13.5 11/11/2023   HGB 15.6 11/04/2023   HGB 13.7 08/09/2023   Lab Results  Component Value Date   WBC 12.3 (H) 11/11/2023   PLT 161 11/11/2023   Lab Results  Component Value Date   INR 0.99 03/29/2013   Lab Results  Component Value Date   NA 135 11/11/2023   K 4.1 11/11/2023   CL 104 11/11/2023   CO2 23 11/11/2023   BUN 17 11/11/2023   CREATININE 0.57 (L) 11/11/2023   GLUCOSE 162 (H) 11/11/2023    Discharge Medications:   Allergies as of 11/11/2023  Reactions   Codeine Itching        Medication List     STOP taking these medications    meloxicam 15 MG tablet Commonly known as: MOBIC   naltrexone 50 MG tablet Commonly known as: DEPADE   topiramate 25 MG capsule Commonly known as: TOPAMAX       TAKE these medications    acetaminophen 500 MG tablet Commonly known as: TYLENOL Take 2 tablets (1,000 mg total) by mouth every 8 (eight) hours.   ARIPiprazole 10 MG tablet Commonly known as: ABILIFY Take 10 mg by mouth daily.   celecoxib 200 MG capsule Commonly known as: CeleBREX Take 1 capsule (200 mg total) by mouth 2 (two) times daily for 14 days.   docusate sodium 100 MG capsule Commonly known as: COLACE Take 1 capsule (100 mg total) by mouth 2 (two) times  daily.   enoxaparin 40 MG/0.4ML injection Commonly known as: LOVENOX Inject 0.4 mLs (40 mg total) into the skin daily for 14 days.   gabapentin 300 MG capsule Commonly known as: NEURONTIN Take 300 mg by mouth 3 (three) times daily.   glipiZIDE 5 MG tablet Commonly known as: GLUCOTROL Take 5 mg by mouth every morning.   lisinopril 10 MG tablet Commonly known as: ZESTRIL Take 10 mg by mouth daily.   metFORMIN 1000 MG tablet Commonly known as: GLUCOPHAGE Take 1,000 mg by mouth 2 (two) times daily with a meal.   ondansetron 4 MG tablet Commonly known as: ZOFRAN Take 1 tablet (4 mg total) by mouth every 6 (six) hours as needed for nausea.   oxyCODONE 5 MG immediate release tablet Commonly known as: Roxicodone Take 0.5-1 tablets (2.5-5 mg total) by mouth every 8 (eight) hours as needed for breakthrough pain.   traMADol 50 MG tablet Commonly known as: ULTRAM Take 1 tablet (50 mg total) by mouth every 6 (six) hours as needed for moderate pain (pain score 4-6). What changed: reasons to take this   traZODone 100 MG tablet Commonly known as: DESYREL Take 200 mg by mouth at bedtime.        Diagnostic Studies: DG Knee Right Port Result Date: 11/10/2023 CLINICAL DATA:  S/P total knee arthroplasty, right [id number] EXAM: PORTABLE RIGHT KNEE - 1-2 VIEW COMPARISON:  None Available. FINDINGS: Right total knee arthroplasty is located without periprosthetic fracture. Soft tissue lucency compatible with recent surgery. Normal alignment. IMPRESSION: Right total knee arthroplasty without complicating features. Electronically Signed   By: Richarda Overlie M.D.   On: 11/10/2023 10:30    Disposition:      Follow-up Information     Evon Slack, PA-C Follow up in 2 week(s).   Specialties: Orthopedic Surgery, Emergency Medicine Contact information: [address] [place] [address] [phone]                  Signed: Patience Musca 11/11/2023, 8:15 AM

## 2023-11-11 NOTE — Discharge Instructions (Signed)
Instructions after Total Knee Replacement   Clarence Cook M.D.     Dept. of Orthopaedics & Sports Medicine  [facility]  [address]  [place], [place]  [address]  Phone: [phone]   Fax: [phone]    DIET: Drink plenty of non-alcoholic fluids. Resume your normal diet. Include foods high in fiber.  ACTIVITY:  You may use crutches or a walker with weight-bearing as tolerated, unless instructed otherwise. You may be weaned off of the walker or crutches by your Physical Therapist.  Do NOT place pillows under the knee. Anything placed under the knee could limit your ability to straighten the knee.   Continue doing gentle exercises. Exercising will reduce the pain and swelling, increase motion, and prevent muscle weakness.   Please continue to use the TED compression stockings for 2 weeks. You may remove the stockings at night, but should reapply them in the morning. Do not drive or operate any equipment until instructed.  WOUND CARE:  Continue to use the PolarCare or ice packs periodically to reduce pain and swelling. You may begin showering 3 days after surgery with honeycomb dressing. Remove honeycomb dressing 7 days after surgery and continue showering. Allow dermabond to fall off on its own.  MEDICATIONS: You may resume your regular medications. Please take the pain medication as prescribed on the medication. Do not take pain medication on an empty stomach. You have been given a prescription for a blood thinner (Lovenox or Coumadin). Please take the medication as instructed. (NOTE: After completing a 2 week course of Lovenox, take one 81 mg Enteric-coated aspirin twice a day for 3 additional weeks. This along with elevation will help reduce the possibility of phlebitis in your operated leg.) Do not drive or drink alcoholic beverages when taking pain medications.  POSTOPERATIVE CONSTIPATION PROTOCOL Constipation - defined medically as fewer than three stools per  week and severe constipation as less than one stool per week.  One of the most common issues patients have following surgery is constipation.  Even if you have a regular bowel pattern at home, your normal regimen is likely to be disrupted due to multiple reasons following surgery.  Combination of anesthesia, postoperative narcotics, change in appetite and fluid intake all can affect your bowels.  In order to avoid complications following surgery, here are some recommendations in order to help you during your recovery period.  Colace (docusate) - Pick up an over-the-counter form of Colace or another stool softener and take twice a day as long as you are requiring postoperative pain medications.  Take with a full glass of water daily.  If you experience loose stools or diarrhea, hold the colace until you stool forms back up.  If your symptoms do not get better within 1 week or if they get worse, check with your doctor.  Dulcolax (bisacodyl) - Pick up over-the-counter and take as directed by the product packaging as needed to assist with the movement of your bowels.  Take with a full glass of water.  Use this product as needed if not relieved by Colace only.   MiraLax (polyethylene glycol) - Pick up over-the-counter to have on hand.  MiraLax is a solution that will increase the amount of water in your bowels to assist with bowel movements.  Take as directed and can mix with a glass of water, juice, soda, coffee, or tea.  Take if you go more than two days without a movement. Do not use MiraLax more than once per day.  Call your doctor if you are still constipated or irregular after using this medication for 7 days in a row.  If you continue to have problems with postoperative constipation, please contact the office for further assistance and recommendations.  If you experience "the worst abdominal pain ever" or develop nausea or vomiting, please contact the office immediatly for further recommendations for  treatment.   CALL THE OFFICE FOR: Temperature above 101 degrees Excessive bleeding or drainage on the dressing. Excessive swelling, coldness, or paleness of the toes. Persistent nausea and vomiting.  FOLLOW-UP:  You should have an appointment to return to the office in 14 days after surgery. Arrangements have been made for continuation of Physical Therapy (either home therapy or outpatient therapy).

## 2023-11-11 NOTE — Plan of Care (Signed)
doucmented

## 2024-02-02 ENCOUNTER — Other Ambulatory Visit: Payer: Self-pay | Admitting: Otolaryngology

## 2024-02-02 DIAGNOSIS — K118 Other diseases of salivary glands: Secondary | ICD-10-CM

## 2024-02-07 ENCOUNTER — Encounter: Payer: Self-pay | Admitting: Otolaryngology

## 2024-02-08 ENCOUNTER — Ambulatory Visit
Admission: RE | Admit: 2024-02-08 | Discharge: 2024-02-08 | Disposition: A | Discharge status: Home / Self Care | Payer: MEDICAID | Source: Ambulatory Visit | Attending: Otolaryngology | Admitting: Otolaryngology

## 2024-02-08 DIAGNOSIS — K118 Other diseases of salivary glands: Secondary | ICD-10-CM

## 2024-02-08 MED ORDER — IOPAMIDOL (ISOVUE-300) INJECTION 61%
75.0000 mL | Freq: Once | INTRAVENOUS | Status: AC | PRN
  Administered 2024-02-08: 75 mL via INTRAVENOUS

## 2024-02-22 ENCOUNTER — Other Ambulatory Visit: Payer: Self-pay | Admitting: Otolaryngology

## 2024-02-27 ENCOUNTER — Other Ambulatory Visit: Payer: Self-pay | Admitting: Otolaryngology

## 2024-02-27 ENCOUNTER — Encounter
Admission: RE | Admit: 2024-02-27 | Discharge: 2024-02-27 | Disposition: A | Discharge status: Home / Self Care | Payer: MEDICAID | Source: Ambulatory Visit | Attending: Otolaryngology | Admitting: Otolaryngology

## 2024-02-27 ENCOUNTER — Other Ambulatory Visit: Payer: Self-pay

## 2024-02-27 ENCOUNTER — Encounter: Payer: Self-pay | Admitting: Otolaryngology

## 2024-02-27 NOTE — Patient Instructions (Signed)
 Your procedure is scheduled on:  To find out your arrival time, please call [phone] between 1PM - 3PM on:    Report to the Registration Desk on the 1st floor of the [facility]. Valet parking is available.  If your arrival time is 6:00 am, do not arrive before that time as the [facility] entrance doors do not open until 6:00 am.  REMEMBER: Instructions that are not followed completely may result in serious medical risk, up to and including death; or upon the discretion of your surgeon and anesthesiologist your surgery may need to be rescheduled.  Do not eat food after midnight the night before surgery.  No gum chewing or hard candies.  You may however, drink CLEAR liquids up to 2 hours before you are scheduled to arrive for your surgery. Do not drink anything within 2 hours of your scheduled arrival time.  Clear liquids include: - water  - apple juice without pulp - gatorade (not RED colors) - black coffee or tea (Do NOT add milk or creamers to the coffee or tea) Do NOT drink anything that is not on this list.  Type 1 and Type 2 diabetics should only drink water.  In addition, your doctor has ordered for you to drink the provided:  Ensure Pre-Surgery Clear Carbohydrate Drink  Gatorade G2 Drinking this carbohydrate drink up to two hours before surgery helps to reduce insulin resistance and improve patient outcomes. Please complete drinking 2 hours before scheduled arrival time.  One week prior to surgery: Stop Anti-inflammatories (NSAIDS) such as Advil, Aleve, Ibuprofen, Motrin, Naproxen, Naprosyn and Aspirin based products such as Excedrin, Goody's Powder, BC Powder. You may however, continue to take Tylenol if needed for pain up until the day of surgery.  Stop ANY OVER THE COUNTER supplements until after surgery.  Continue taking all prescribed medications with the exception of the following:  **Follow guidelines for insulin and diabetes medications**  Follow  recommendations from Cardiologist or PCP regarding stopping blood thinners.  TAKE ONLY THESE MEDICATIONS THE MORNING OF SURGERY WITH A SIP OF WATER:    Antacid (take one the night before and one on the morning of surgery - helps to prevent nausea after surgery.)  Use inhalers on the day of surgery and bring to the hospital.  Fleets enema or bowel prep as directed.  No Alcohol for 24 hours before or after surgery.  No Smoking including e-cigarettes for 24 hours before surgery.  No chewable tobacco products for at least 6 hours before surgery.  No nicotine patches on the day of surgery.  Do not use any "recreational" drugs for at least a week (preferably 2 weeks) before your surgery.  Please be advised that the combination of cocaine and anesthesia may have negative outcomes, up to and including death. If you test positive for cocaine, your surgery will be cancelled.  On the morning of surgery brush your teeth with toothpaste and water, you may rinse your mouth with mouthwash if you wish. Do not swallow any toothpaste or mouthwash.  Use CHG Soap or wipes as directed on instruction sheet.  Do not wear lotions, powders, or perfumes.   Do not shave body hair from the neck down 48 hours before surgery.  Wear comfortable clothing (specific to your surgery type) to the hospital.  Do not wear jewelry, make-up, hairpins, clips or nail polish.  For welded (permanent) jewelry: bracelets, anklets, waist bands, etc.  Please have this removed prior to surgery.  If it is  not removed, there is a chance that hospital personnel will need to cut it off on the day of surgery. Contact lenses, hearing aids and dentures may not be worn into surgery.  Bring your C-PAP to the hospital in case you may have to spend the night.   Do not bring valuables to the hospital. [facility] is not responsible for any missing/lost belongings or valuables.   Total Shoulder Arthroplasty:  use Benzoyl Peroxide 5% Gel  as directed on instruction sheet.  Notify your doctor if there is any change in your medical condition (cold, fever, infection).  If you are being discharged the day of surgery, you will not be allowed to drive home. You will need a responsible individual to drive you home and stay with you for 24 hours after surgery.   If you are taking public transportation, you will need to have a responsible individual with you.  If you are being admitted to the hospital overnight, leave your suitcase in the car. After surgery it may be brought to your room.  In case of increased patient census, it may be necessary for you, the patient, to continue your postoperative care in the Same Day Surgery department.  After surgery, you can help prevent lung complications by doing breathing exercises.  Take deep breaths and cough every 1-2 hours. Your doctor may order a device called an Incentive Spirometer to help you take deep breaths. When coughing or sneezing, hold a pillow firmly against your incision with both hands. This is called "splinting." Doing this helps protect your incision. It also decreases belly discomfort.  Surgery Visitation Policy:  Patients undergoing a surgery or procedure may have two family members or support persons with them as long as the person is not COVID-19 positive or experiencing its symptoms.   Inpatient Visitation:    Visiting hours are 7 a.m. to 8 p.m. Up to four visitors are allowed at one time in a patient room. The visitors may rotate out with other people during the day. One designated support person (adult) may remain overnight.  Due to an increase in RSV and influenza rates and associated hospitalizations, children ages 77 and under will not be able to visit patients in [facility]. Masks continue to be strongly recommended.  Please call the Pre-admissions Testing Dept. at [phone] if you have any questions about these instructions.

## 2024-02-28 ENCOUNTER — Other Ambulatory Visit: Payer: Self-pay

## 2024-02-28 ENCOUNTER — Ambulatory Visit
Admission: RE | Admit: 2024-02-28 | Discharge: 2024-02-28 | Disposition: A | Discharge status: Home / Self Care | Payer: MEDICAID | Attending: Otolaryngology | Admitting: Otolaryngology

## 2024-02-28 ENCOUNTER — Ambulatory Visit: Payer: MEDICAID | Admitting: Anesthesiology

## 2024-02-28 ENCOUNTER — Encounter: Payer: Self-pay | Admitting: Otolaryngology

## 2024-02-28 ENCOUNTER — Encounter
Admission: RE | Disposition: A | Discharge status: Home / Self Care | Payer: Self-pay | Source: Home / Self Care | Attending: Otolaryngology

## 2024-02-28 DIAGNOSIS — J449 Chronic obstructive pulmonary disease, unspecified: Secondary | ICD-10-CM | POA: Insufficient documentation

## 2024-02-28 DIAGNOSIS — I10 Essential (primary) hypertension: Secondary | ICD-10-CM | POA: Insufficient documentation

## 2024-02-28 DIAGNOSIS — K115 Sialolithiasis: Secondary | ICD-10-CM | POA: Insufficient documentation

## 2024-02-28 DIAGNOSIS — F17290 Nicotine dependence, other tobacco product, uncomplicated: Secondary | ICD-10-CM | POA: Diagnosis not present

## 2024-02-28 DIAGNOSIS — E119 Type 2 diabetes mellitus without complications: Secondary | ICD-10-CM

## 2024-02-28 DIAGNOSIS — E1142 Type 2 diabetes mellitus with diabetic polyneuropathy: Secondary | ICD-10-CM | POA: Diagnosis not present

## 2024-02-28 HISTORY — DX: Chronic obstructive pulmonary disease, unspecified: J44.9

## 2024-02-28 HISTORY — PX: SALIVARY STONE REMOVAL: SHX5213

## 2024-02-28 LAB — GLUCOSE, CAPILLARY
Glucose-Capillary: 180 mg/dL — ABNORMAL HIGH (ref 70–99)
Glucose-Capillary: 168 mg/dL — ABNORMAL HIGH (ref 70–99)

## 2024-02-28 SURGERY — REMOVAL, CALCULUS, SALIVARY DUCT
Anesthesia: General | Site: Mouth | Laterality: Right

## 2024-02-28 MED ORDER — HYDRALAZINE HCL 20 MG/ML IJ SOLN
10.0000 mg | Freq: Once | INTRAMUSCULAR | Status: AC
  Administered 2024-02-28: 10 mg via INTRAVENOUS

## 2024-02-28 MED ORDER — LIDOCAINE-EPINEPHRINE 1 %-1:100000 IJ SOLN
INTRAMUSCULAR | Status: AC
Start: 2024-02-28 — End: ?
  Filled 2024-02-28: qty 1

## 2024-02-28 MED ORDER — CEFAZOLIN SODIUM-DEXTROSE 2-4 GM/100ML-% IV SOLN
2.0000 g | Freq: Once | INTRAVENOUS | Status: AC
  Administered 2024-02-28: 2 g via INTRAVENOUS

## 2024-02-28 MED ORDER — ORAL CARE MOUTH RINSE: 15.0000 mL | Freq: Once | OROMUCOSAL | Status: AC

## 2024-02-28 MED ORDER — CEFAZOLIN SODIUM-DEXTROSE 2-4 GM/100ML-% IV SOLN
INTRAVENOUS | Status: AC
  Filled 2024-02-28: qty 100

## 2024-02-28 MED ORDER — OXYCODONE HCL 5 MG PO TABS: 5.0000 mg | ORAL_TABLET | Freq: Once | ORAL | Status: DC | PRN

## 2024-02-28 MED ORDER — ACETAMINOPHEN 10 MG/ML IV SOLN
INTRAVENOUS | Status: DC | PRN
  Administered 2024-02-28: 1000 mg via INTRAVENOUS

## 2024-02-28 MED ORDER — SODIUM CHLORIDE 0.9 % IV SOLN: INTRAVENOUS | Status: DC

## 2024-02-28 MED ORDER — LIDOCAINE HCL (PF) 2 % IJ SOLN
INTRAMUSCULAR | Status: AC
  Filled 2024-02-28: qty 5

## 2024-02-28 MED ORDER — HYDROCODONE-ACETAMINOPHEN 5-325 MG PO TABS
1.0000 | ORAL_TABLET | Freq: Four times a day (QID) | ORAL | 0 refills | Status: AC | PRN
Start: 2024-02-28 — End: ?

## 2024-02-28 MED ORDER — OXYCODONE HCL 5 MG/5ML PO SOLN: 5.0000 mg | Freq: Once | ORAL | Status: DC | PRN

## 2024-02-28 MED ORDER — TRIPLE ANTIBIOTIC 3.5-400-5000 EX OINT
TOPICAL_OINTMENT | CUTANEOUS | Status: AC
Start: 2024-02-28 — End: ?
  Filled 2024-02-28: qty 1

## 2024-02-28 MED ORDER — HYDRALAZINE HCL 20 MG/ML IJ SOLN
INTRAMUSCULAR | Status: AC
  Filled 2024-02-28: qty 1

## 2024-02-28 MED ORDER — ROCURONIUM BROMIDE 100 MG/10ML IV SOLN
INTRAVENOUS | Status: DC | PRN
  Administered 2024-02-28: 30 mg via INTRAVENOUS

## 2024-02-28 MED ORDER — ACETAMINOPHEN 10 MG/ML IV SOLN
INTRAVENOUS | Status: AC
  Filled 2024-02-28: qty 100

## 2024-02-28 MED ORDER — MIDAZOLAM HCL 2 MG/2ML IJ SOLN
INTRAMUSCULAR | Status: DC | PRN
  Administered 2024-02-28: 2 mg via INTRAVENOUS

## 2024-02-28 MED ORDER — CHLORHEXIDINE GLUCONATE 0.12 % MT SOLN: 15.0000 mL | Freq: Two times a day (BID) | OROMUCOSAL | 0 refills | Status: AC

## 2024-02-28 MED ORDER — OXYMETAZOLINE HCL 0.05 % NA SOLN
NASAL | Status: DC | PRN
  Administered 2024-02-28 (×2): 2 via NASAL

## 2024-02-28 MED ORDER — ACETAMINOPHEN 10 MG/ML IV SOLN: 1000.0000 mg | Freq: Once | INTRAVENOUS | Status: DC | PRN

## 2024-02-28 MED ORDER — FENTANYL CITRATE (PF) 100 MCG/2ML IJ SOLN
INTRAMUSCULAR | Status: DC | PRN
  Administered 2024-02-28 (×2): 50 ug via INTRAVENOUS

## 2024-02-28 MED ORDER — PROPOFOL 10 MG/ML IV BOLUS
INTRAVENOUS | Status: AC
  Filled 2024-02-28: qty 20

## 2024-02-28 MED ORDER — LACTATED RINGERS IV SOLN: INTRAVENOUS | Status: DC

## 2024-02-28 MED ORDER — PROPOFOL 10 MG/ML IV BOLUS
INTRAVENOUS | Status: DC | PRN
  Administered 2024-02-28: 160 mg via INTRAVENOUS

## 2024-02-28 MED ORDER — DEXAMETHASONE SODIUM PHOSPHATE 10 MG/ML IJ SOLN
INTRAMUSCULAR | Status: AC
  Filled 2024-02-28: qty 1

## 2024-02-28 MED ORDER — CHLORHEXIDINE GLUCONATE 0.12 % MT SOLN
OROMUCOSAL | Status: AC
  Filled 2024-02-28: qty 15

## 2024-02-28 MED ORDER — MIDAZOLAM HCL 2 MG/2ML IJ SOLN
INTRAMUSCULAR | Status: AC
  Filled 2024-02-28: qty 2

## 2024-02-28 MED ORDER — ROCURONIUM BROMIDE 10 MG/ML (PF) SYRINGE
PREFILLED_SYRINGE | INTRAVENOUS | Status: AC
  Filled 2024-02-28: qty 10

## 2024-02-28 MED ORDER — ONDANSETRON HCL 4 MG/2ML IJ SOLN
INTRAMUSCULAR | Status: AC
  Filled 2024-02-28: qty 2

## 2024-02-28 MED ORDER — FENTANYL CITRATE (PF) 100 MCG/2ML IJ SOLN: 25.0000 ug | INTRAMUSCULAR | Status: DC | PRN

## 2024-02-28 MED ORDER — PREDNISONE 10 MG (21) PO TBPK: ORAL_TABLET | ORAL | 0 refills | Status: AC

## 2024-02-28 MED ORDER — DEXAMETHASONE SODIUM PHOSPHATE 10 MG/ML IJ SOLN
INTRAMUSCULAR | Status: DC | PRN
  Administered 2024-02-28: 10 mg via INTRAVENOUS

## 2024-02-28 MED ORDER — LACTATED RINGERS IV SOLN: INTRAVENOUS | Status: DC | PRN

## 2024-02-28 MED ORDER — LIDOCAINE HCL (CARDIAC) PF 100 MG/5ML IV SOSY
PREFILLED_SYRINGE | INTRAVENOUS | Status: DC | PRN
  Administered 2024-02-28: 100 mg via INTRAVENOUS

## 2024-02-28 MED ORDER — FENTANYL CITRATE (PF) 100 MCG/2ML IJ SOLN
INTRAMUSCULAR | Status: AC
Start: 2024-02-28 — End: ?
  Filled 2024-02-28: qty 2

## 2024-02-28 MED ORDER — DEXMEDETOMIDINE HCL IN NACL 80 MCG/20ML IV SOLN
INTRAVENOUS | Status: DC | PRN
  Administered 2024-02-28: 8 ug via INTRAVENOUS

## 2024-02-28 MED ORDER — OXYMETAZOLINE HCL 0.05 % NA SOLN
NASAL | Status: AC
  Filled 2024-02-28: qty 30

## 2024-02-28 MED ORDER — SUCCINYLCHOLINE CHLORIDE 200 MG/10ML IV SOSY
PREFILLED_SYRINGE | INTRAVENOUS | Status: AC
  Filled 2024-02-28: qty 10

## 2024-02-28 MED ORDER — ONDANSETRON HCL 4 MG PO TABS: 4.0000 mg | ORAL_TABLET | Freq: Three times a day (TID) | ORAL | 0 refills | Status: AC | PRN

## 2024-02-28 MED ORDER — CHLORHEXIDINE GLUCONATE 0.12 % MT SOLN
15.0000 mL | Freq: Once | OROMUCOSAL | Status: AC
  Administered 2024-02-28: 15 mL via OROMUCOSAL

## 2024-02-28 MED ORDER — ONDANSETRON HCL 4 MG/2ML IJ SOLN: 4.0000 mg | Freq: Once | INTRAMUSCULAR | Status: DC | PRN

## 2024-02-28 MED ORDER — LIDOCAINE-EPINEPHRINE 1 %-1:100000 IJ SOLN
INTRAMUSCULAR | Status: DC | PRN
  Administered 2024-02-28: 1 mL

## 2024-02-28 MED ORDER — ONDANSETRON HCL 4 MG/2ML IJ SOLN
INTRAMUSCULAR | Status: DC | PRN
  Administered 2024-02-28: 4 mg via INTRAVENOUS

## 2024-02-28 MED ORDER — SUCCINYLCHOLINE CHLORIDE 200 MG/10ML IV SOSY
PREFILLED_SYRINGE | INTRAVENOUS | Status: DC | PRN
  Administered 2024-02-28: 100 mg via INTRAVENOUS

## 2024-02-28 MED ORDER — AMOXICILLIN-POT CLAVULANATE 875-125 MG PO TABS: 1.0000 | ORAL_TABLET | Freq: Two times a day (BID) | ORAL | 0 refills | Status: AC

## 2024-02-28 SURGICAL SUPPLY — 19 items
ATTRACTOMAT 16X20 MAGNETIC DRP (DRAPES) ×1 IMPLANT
BLADE SURG 15 STRL LF DISP TIS (BLADE) ×1 IMPLANT
CORD BIP STRL DISP 12FT (MISCELLANEOUS) ×1 IMPLANT
ELECTRODE REM PT RTRN 9FT ADLT (ELECTROSURGICAL) ×1 IMPLANT
FORCEPS JEWEL BIP 4-3/4 STR (INSTRUMENTS) ×1 IMPLANT
GAUZE 4X4 16PLY ~~LOC~~+RFID DBL (SPONGE) ×1 IMPLANT
GAUZE PACK 2X3YD (PACKING) IMPLANT
GLOVE BIO SURGEON STRL SZ7.5 (GLOVE) ×1 IMPLANT
GOWN STRL REUS W/ TWL LRG LVL3 (GOWN DISPOSABLE) ×2 IMPLANT
MANIFOLD NEPTUNE II (INSTRUMENTS) ×1 IMPLANT
MARKER SKIN DUAL TIP RULER LAB (MISCELLANEOUS) ×1 IMPLANT
NS IRRIG 500ML POUR BTL (IV SOLUTION) ×1 IMPLANT
PACK HEAD/NECK (MISCELLANEOUS) ×1 IMPLANT
SPONGE KITTNER 5P (MISCELLANEOUS) ×1 IMPLANT
SUT ETHILON 5-0 FS-2 18 BLK (SUTURE) ×1 IMPLANT
SUT SILK 2-0 18XBRD TIE 12 (SUTURE) ×1 IMPLANT
SUT VIC AB 4-0 BRD 54 (SUTURE) ×1 IMPLANT
SUT VIC AB 5 0 P2 18 (SUTURE) IMPLANT
SUTURE ETHLN 4-0 FS2 18XMF BLK (SUTURE) ×1 IMPLANT

## 2024-02-28 NOTE — Op Note (Signed)
..  02/28/2024  8:00 AM    Clarence Cook  [id number]   Pre-Op Dx:  Sialolithiasis [K11.5]  Post-op Dx: Sialolithiasis [K11.5]  Proc:  1)  Sialolithotomy of right Wharton's duct with stone removal  2)  Sialodochoplasty  Surg: Azalea Lento Amayrany Cafaro  Assist:  Leonidas Ramming  Anes:  General NasoEndotracheal  EBL:  <53ml  Comp:  None  Findings:  large 3cm stone in mid-Wharton's duct  Procedure: After the patient was identified in holding and the history and physical and consent was reviewed, the patient was taken to the operating room and placed in a supine position.  General naso-endotracheal anesthesia was induced in the normal fashion.  At this time, the patient was rotated 45 degrees and prepped and draped in a sterile fashion.  At this time, a side biting mouth gag mouthgag was inserted into the patient's oral cavity without injury to teeth, lips, or gums.  1cc of 1% lidocaine  with 1:100,000 epinephrine  was injected into the floor of mouth.  A zero silk suture was placed in the anterior 1/3 of the patient's tongue for retraction purposes.  This was gently retracted to the patient's left side.  Manual palpation was used to deliver the stone medially and near the mucosa of the floor of mouth.  A 15 blade was used to incise the mucosa overlying the stone along the anterior portion of the stone.  Care was taken to avoid trauma to the lingual nerve.  Blunt dissection was done through the sublingual gland to the Wharton's duct.  This showed a large stone embedded in the duct.  A 15 blade was used to incise the duct and the large 3cm stone was delivered using right angle forceps.  Care was take to ensure all of the stone was removed.  Copious saline irrigation was done.  At this time the sialodochoplasty was done.  Using 5.0 vicryl the epithelium of the duct was sutured to the floor of mouth mucosa in an interupted fashion along the lateral, medial, and anterior sialolithotomy.  Meticulous  hemostasis was continued.  At this time the mouth gag was removed.  The zero silk suture was removed.  Care of the patient was transferred to anesthesia.    Following this  The care of patient was returned to anesthesia, awakened, and transferred to recovery in stable condition.  Dispo:  PACU to home  Plan: Soft diet.  Limit exercise and strenuous activity for 2 weeks.  Fluid hydration  Recheck my office in one week.   Rhys Anchondo 8:00 AM 02/28/2024

## 2024-02-28 NOTE — Anesthesia Postprocedure Evaluation (Signed)
 Anesthesia Post Note  Patient: Clarence Cook  Procedure(s) Performed: REMOVAL, CALCULUS, SALIVARY DUCT (Right: Mouth)  Patient location during evaluation: PACU Anesthesia Type: General Level of consciousness: awake and alert, oriented and patient cooperative Pain management: pain level controlled Vital Signs Assessment: post-procedure vital signs reviewed and stable Respiratory status: spontaneous breathing, nonlabored ventilation and respiratory function stable Cardiovascular status: blood pressure returned to baseline and stable Postop Assessment: adequate PO intake Anesthetic complications: no   No notable events documented.   Last Vitals:  Vitals:   02/28/24 0815 02/28/24 0830  BP: (!) 159/115 (!) 151/111  Pulse: 67 (!) 58  Resp: 18 11  Temp:    SpO2: 100% 100%    Last Pain:  Vitals:   02/28/24 0809  TempSrc:   PainSc: 0-No pain                 Dorothey Gate
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
## 2024-02-28 NOTE — Transfer of Care (Addendum)
 Immediate Anesthesia Transfer of Care Note  Patient: Clarence Cook  Procedure(s) Performed: Procedure(s) with comments: REMOVAL, CALCULUS, SALIVARY DUCT (Right) - RIGHT SALIVARY STONE REMOVAL  Patient Location: PACU  Anesthesia Type:General  Level of Consciousness: sedated  Airway & Oxygen Therapy: Patient Spontanous Breathing and Patient connected to face mask oxygen  Post-op Assessment: Report given to RN and Post -op Vital signs reviewed and stable  Post vital signs: Reviewed and stable  Last Vitals:  Vitals:   02/28/24 0617 02/28/24 0809  BP: 129/88 (!) 164/97  Pulse: 73 77  Resp: 16 14  Temp: (!) 36.3 C   SpO2: 100% 100%    Complications: No apparent anesthesia complications

## 2024-02-28 NOTE — H&P (Addendum)
..  History and Physical paper copy reviewed and updated date of procedure and will be scanned into system.  Patient seen and examined and marked.  Ancef  2grams ordered for pre-operative antibiotic.

## 2024-02-28 NOTE — Anesthesia Procedure Notes (Signed)
 Procedure Name: Intubation Date/Time: 02/28/2024 7:32 AM  Performed by: Racheal Buddle, CRNAPre-anesthesia Checklist: Patient identified, Patient being monitored, Timeout performed, Emergency Drugs available and Suction available Patient Re-evaluated:Patient Re-evaluated prior to induction Oxygen Delivery Method: Circle system utilized Preoxygenation: Pre-oxygenation with 100% oxygen Induction Type: IV induction Ventilation: Mask ventilation without difficulty Laryngoscope Size: Mac and 4 Grade View: Grade I Nasal Tubes: Right, Nasal prep performed and Nasal Rae Tube size: 7.0 mm Number of attempts: 1 Airway Equipment and Method: Stylet Placement Confirmation: ETT inserted through vocal cords under direct vision, positive ETCO2 and breath sounds checked- equal and bilateral Secured at: 21 cm Tube secured with: Tape Dental Injury: Teeth and Oropharynx as per pre-operative assessment

## 2024-02-29 ENCOUNTER — Encounter: Payer: Self-pay | Admitting: Otolaryngology

## 2024-02-29 LAB — SURGICAL PATHOLOGY

## 2024-03-11 ENCOUNTER — Other Ambulatory Visit: Payer: Self-pay

## 2024-03-11 ENCOUNTER — Encounter (HOSPITAL_COMMUNITY): Payer: Self-pay

## 2024-03-11 ENCOUNTER — Emergency Department (HOSPITAL_COMMUNITY): Payer: MEDICAID

## 2024-03-11 ENCOUNTER — Emergency Department (HOSPITAL_COMMUNITY)
Admission: EM | Admit: 2024-03-11 | Discharge: 2024-03-11 | Disposition: A | Discharge status: Home / Self Care | Payer: MEDICAID | Attending: Student | Admitting: Student

## 2024-03-11 DIAGNOSIS — Z96653 Presence of artificial knee joint, bilateral: Secondary | ICD-10-CM | POA: Insufficient documentation

## 2024-03-11 DIAGNOSIS — S3991XA Unspecified injury of abdomen, initial encounter: Secondary | ICD-10-CM | POA: Diagnosis not present

## 2024-03-11 DIAGNOSIS — S299XXA Unspecified injury of thorax, initial encounter: Secondary | ICD-10-CM | POA: Diagnosis not present

## 2024-03-11 DIAGNOSIS — Y9355 Activity, bike riding: Secondary | ICD-10-CM | POA: Insufficient documentation

## 2024-03-11 DIAGNOSIS — S0990XA Unspecified injury of head, initial encounter: Secondary | ICD-10-CM | POA: Diagnosis present

## 2024-03-11 DIAGNOSIS — J449 Chronic obstructive pulmonary disease, unspecified: Secondary | ICD-10-CM | POA: Diagnosis not present

## 2024-03-11 DIAGNOSIS — Y906 Blood alcohol level of 120-199 mg/100 ml: Secondary | ICD-10-CM | POA: Diagnosis not present

## 2024-03-11 DIAGNOSIS — M25552 Pain in left hip: Secondary | ICD-10-CM | POA: Insufficient documentation

## 2024-03-11 DIAGNOSIS — Z87891 Personal history of nicotine dependence: Secondary | ICD-10-CM | POA: Insufficient documentation

## 2024-03-11 DIAGNOSIS — F10920 Alcohol use, unspecified with intoxication, uncomplicated: Secondary | ICD-10-CM | POA: Insufficient documentation

## 2024-03-11 DIAGNOSIS — I10 Essential (primary) hypertension: Secondary | ICD-10-CM | POA: Insufficient documentation

## 2024-03-11 DIAGNOSIS — S0083XA Contusion of other part of head, initial encounter: Secondary | ICD-10-CM | POA: Diagnosis not present

## 2024-03-11 DIAGNOSIS — E119 Type 2 diabetes mellitus without complications: Secondary | ICD-10-CM | POA: Diagnosis not present

## 2024-03-11 DIAGNOSIS — Z79899 Other long term (current) drug therapy: Secondary | ICD-10-CM | POA: Diagnosis not present

## 2024-03-11 DIAGNOSIS — Z7984 Long term (current) use of oral hypoglycemic drugs: Secondary | ICD-10-CM | POA: Diagnosis not present

## 2024-03-11 DIAGNOSIS — T148XXA Other injury of unspecified body region, initial encounter: Secondary | ICD-10-CM

## 2024-03-11 DIAGNOSIS — R9389 Abnormal findings on diagnostic imaging of other specified body structures: Secondary | ICD-10-CM

## 2024-03-11 LAB — COMPREHENSIVE METABOLIC PANEL WITH GFR
ALT: 19 U/L (ref 0–44)
AST: 25 U/L (ref 15–41)
Albumin: 3.8 g/dL (ref 3.5–5.0)
Alkaline Phosphatase: 79 U/L (ref 38–126)
Anion gap: 16 — ABNORMAL HIGH (ref 5–15)
BUN: 12 mg/dL (ref 6–20)
CO2: 18 mmol/L — ABNORMAL LOW (ref 22–32)
Calcium: 9 mg/dL (ref 8.9–10.3)
Chloride: 106 mmol/L (ref 98–111)
Creatinine, Ser: 0.87 mg/dL (ref 0.61–1.24)
GFR, Estimated: >60 mL/min (ref >60)
Glucose, Bld: 141 mg/dL — ABNORMAL HIGH (ref 70–99)
Potassium: 4 mmol/L (ref 3.5–5.1)
Sodium: 140 mmol/L (ref 135–145)
Total Bilirubin: 0.6 mg/dL (ref 0.0–1.2)
Total Protein: 6.5 g/dL (ref 6.5–8.1)

## 2024-03-11 LAB — TYPE AND SCREEN
ABO/RH(D): O POS
Antibody Screen: NEGATIVE

## 2024-03-11 LAB — CBC WITH DIFFERENTIAL/PLATELET
Abs Immature Granulocytes: 0.06 10*3/uL (ref 0.00–0.07)
Basophils Absolute: 0.1 10*3/uL (ref 0.0–0.1)
Basophils Relative: 1 %
Eosinophils Absolute: 0.3 10*3/uL (ref 0.0–0.5)
Eosinophils Relative: 3 %
HCT: 47.1 % (ref 39.0–52.0)
Hemoglobin: 15.8 g/dL (ref 13.0–17.0)
Immature Granulocytes: 1 %
Lymphocytes Relative: 24 %
Lymphs Abs: 2.6 10*3/uL (ref 0.7–4.0)
MCH: 30 pg (ref 26.0–34.0)
MCHC: 33.5 g/dL (ref 30.0–36.0)
MCV: 89.5 fL (ref 80.0–100.0)
Monocytes Absolute: 0.9 10*3/uL (ref 0.1–1.0)
Monocytes Relative: 8 %
Neutro Abs: 6.8 10*3/uL (ref 1.7–7.7)
Neutrophils Relative %: 63 %
Platelets: 213 10*3/uL (ref 150–400)
RBC: 5.26 MIL/uL (ref 4.22–5.81)
RDW: 13.4 % (ref 11.5–15.5)
WBC: 10.7 10*3/uL — ABNORMAL HIGH (ref 4.0–10.5)
nRBC: 0 % (ref 0.0–0.2)

## 2024-03-11 LAB — ETHANOL: Alcohol, Ethyl (B): 197 mg/dL — ABNORMAL HIGH (ref <15)

## 2024-03-11 MED ORDER — FENTANYL CITRATE PF 50 MCG/ML IJ SOSY
50.0000 ug | PREFILLED_SYRINGE | Freq: Once | INTRAMUSCULAR | Status: AC
  Administered 2024-03-11: 50 ug via INTRAVENOUS
  Filled 2024-03-11: qty 1

## 2024-03-11 MED ORDER — HYDROCODONE-ACETAMINOPHEN 5-325 MG PO TABS
1.0000 | ORAL_TABLET | Freq: Once | ORAL | Status: AC
  Administered 2024-03-11: 1 via ORAL
  Filled 2024-03-11: qty 1

## 2024-03-11 MED ORDER — IOHEXOL 350 MG/ML SOLN
75.0000 mL | Freq: Once | INTRAVENOUS | Status: AC | PRN
  Administered 2024-03-11: 75 mL via INTRAVENOUS
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
## 2024-03-12 NOTE — Progress Notes (Signed)
 Orthopedic Tech Progress Note Patient Details:  Clarence Cook 1966/10/13 [id number]  Patient ID: Clarence Cook, male   DOB: 1966/09/02, 58 y.o.   MRN: [id number] I attended trauma page. Terryann Fiddler 03/12/2024, 1:48 AM

## 2024-05-18 ENCOUNTER — Other Ambulatory Visit: Payer: Self-pay | Admitting: Specialist

## 2024-05-18 DIAGNOSIS — R590 Localized enlarged lymph nodes: Secondary | ICD-10-CM

## 2024-06-11 ENCOUNTER — Ambulatory Visit
Admission: RE | Admit: 2024-06-11 | Discharge: 2024-06-11 | Disposition: A | Discharge status: Home / Self Care | Payer: MEDICAID | Source: Ambulatory Visit | Attending: Specialist | Admitting: Specialist

## 2024-06-11 DIAGNOSIS — R590 Localized enlarged lymph nodes: Secondary | ICD-10-CM | POA: Insufficient documentation

## 2024-06-11 LAB — POCT I-STAT CREATININE: Creatinine, Ser: 0.7 mg/dL (ref 0.61–1.24)

## 2024-06-11 MED ORDER — IOHEXOL 300 MG/ML  SOLN
75.0000 mL | Freq: Once | INTRAMUSCULAR | Status: AC | PRN
  Administered 2024-06-11: 75 mL via INTRAVENOUS

## 2024-10-15 ENCOUNTER — Other Ambulatory Visit: Payer: Self-pay | Admitting: Specialist

## 2024-10-15 DIAGNOSIS — R590 Localized enlarged lymph nodes: Secondary | ICD-10-CM

## 2024-11-08 ENCOUNTER — Ambulatory Visit: Payer: MEDICAID

## 2024-11-09 ENCOUNTER — Ambulatory Visit: Payer: MEDICAID

## 2024-11-13 ENCOUNTER — Ambulatory Visit
Admission: RE | Admit: 2024-11-13 | Discharge: 2024-11-13 | Disposition: A | Discharge status: Home / Self Care | Payer: MEDICAID | Source: Ambulatory Visit | Attending: Specialist | Admitting: Specialist

## 2024-11-13 DIAGNOSIS — R590 Localized enlarged lymph nodes: Secondary | ICD-10-CM | POA: Diagnosis present

## 2024-11-13 LAB — POCT I-STAT CREATININE: Creatinine, Ser: 0.7 mg/dL (ref 0.61–1.24)

## 2024-11-13 MED ORDER — IOHEXOL 300 MG/ML  SOLN
75.0000 mL | Freq: Once | INTRAMUSCULAR | Status: AC | PRN
  Administered 2024-11-13: 75 mL via INTRAVENOUS
